# Patient Record
Sex: Male | Born: 1960 | Race: Black or African American | Hispanic: No | Smoking: Former smoker
Health system: Southern US, Community
[De-identification: ages and names within clinical notes are randomized; demographics above are authoritative.]

## PROBLEM LIST (undated history)

## (undated) DIAGNOSIS — I639 Cerebral infarction, unspecified: Secondary | ICD-10-CM

## (undated) DIAGNOSIS — K219 Gastro-esophageal reflux disease without esophagitis: Secondary | ICD-10-CM

## (undated) DIAGNOSIS — F32A Depression, unspecified: Secondary | ICD-10-CM

## (undated) DIAGNOSIS — Z87442 Personal history of urinary calculi: Secondary | ICD-10-CM

## (undated) DIAGNOSIS — I219 Acute myocardial infarction, unspecified: Secondary | ICD-10-CM

## (undated) DIAGNOSIS — I5189 Other ill-defined heart diseases: Secondary | ICD-10-CM

## (undated) DIAGNOSIS — F319 Bipolar disorder, unspecified: Secondary | ICD-10-CM

## (undated) DIAGNOSIS — U071 COVID-19: Secondary | ICD-10-CM

## (undated) DIAGNOSIS — J45909 Unspecified asthma, uncomplicated: Secondary | ICD-10-CM

## (undated) DIAGNOSIS — E119 Type 2 diabetes mellitus without complications: Secondary | ICD-10-CM

## (undated) DIAGNOSIS — G4730 Sleep apnea, unspecified: Secondary | ICD-10-CM

## (undated) DIAGNOSIS — K760 Fatty (change of) liver, not elsewhere classified: Secondary | ICD-10-CM

## (undated) DIAGNOSIS — M1990 Unspecified osteoarthritis, unspecified site: Secondary | ICD-10-CM

## (undated) DIAGNOSIS — I10 Essential (primary) hypertension: Secondary | ICD-10-CM

## (undated) HISTORY — DX: Type 2 diabetes mellitus without complications: E11.9

## (undated) HISTORY — DX: Gastro-esophageal reflux disease without esophagitis: K21.9

## (undated) HISTORY — DX: COVID-19: U07.1

## (undated) HISTORY — DX: Unspecified osteoarthritis, unspecified site: M19.90

## (undated) HISTORY — DX: Cerebral infarction, unspecified: I63.9

## (undated) HISTORY — PX: KNEE SURGERY: SHX244

## (undated) HISTORY — DX: Acute myocardial infarction, unspecified: I21.9

## (undated) HISTORY — DX: Sleep apnea, unspecified: G47.30

---

## 1982-10-16 HISTORY — PX: COMPLEX WOUND CLOSURE: SHX6446

## 2020-10-28 ENCOUNTER — Other Ambulatory Visit: Payer: Self-pay | Admitting: Internal Medicine

## 2020-10-28 DIAGNOSIS — R1011 Right upper quadrant pain: Secondary | ICD-10-CM

## 2020-11-11 ENCOUNTER — Other Ambulatory Visit: Payer: Self-pay | Admitting: Internal Medicine

## 2020-11-11 ENCOUNTER — Other Ambulatory Visit: Payer: Self-pay

## 2020-11-11 DIAGNOSIS — R1011 Right upper quadrant pain: Secondary | ICD-10-CM

## 2020-11-11 DIAGNOSIS — R140 Abdominal distension (gaseous): Secondary | ICD-10-CM

## 2020-12-01 ENCOUNTER — Ambulatory Visit
Admission: RE | Admit: 2020-12-01 | Discharge: 2020-12-01 | Disposition: A | Discharge status: Home / Self Care | Payer: BC Managed Care – PPO | Source: Ambulatory Visit | Attending: Internal Medicine | Admitting: Internal Medicine

## 2020-12-01 DIAGNOSIS — R1011 Right upper quadrant pain: Secondary | ICD-10-CM

## 2020-12-01 DIAGNOSIS — R140 Abdominal distension (gaseous): Secondary | ICD-10-CM

## 2020-12-03 ENCOUNTER — Other Ambulatory Visit: Payer: Self-pay | Admitting: Internal Medicine

## 2020-12-03 DIAGNOSIS — R140 Abdominal distension (gaseous): Secondary | ICD-10-CM

## 2020-12-03 DIAGNOSIS — R748 Abnormal levels of other serum enzymes: Secondary | ICD-10-CM

## 2020-12-03 DIAGNOSIS — R1011 Right upper quadrant pain: Secondary | ICD-10-CM

## 2020-12-03 DIAGNOSIS — R932 Abnormal findings on diagnostic imaging of liver and biliary tract: Secondary | ICD-10-CM

## 2020-12-03 DIAGNOSIS — Z8619 Personal history of other infectious and parasitic diseases: Secondary | ICD-10-CM

## 2021-01-01 ENCOUNTER — Other Ambulatory Visit: Payer: Self-pay

## 2021-01-01 ENCOUNTER — Ambulatory Visit
Admission: RE | Admit: 2021-01-01 | Discharge: 2021-01-01 | Disposition: A | Discharge status: Home / Self Care | Payer: BC Managed Care – PPO | Source: Ambulatory Visit | Attending: Internal Medicine | Admitting: Internal Medicine

## 2021-01-01 DIAGNOSIS — R140 Abdominal distension (gaseous): Secondary | ICD-10-CM

## 2021-01-01 DIAGNOSIS — R1011 Right upper quadrant pain: Secondary | ICD-10-CM

## 2021-01-01 DIAGNOSIS — Z8619 Personal history of other infectious and parasitic diseases: Secondary | ICD-10-CM

## 2021-01-01 DIAGNOSIS — R748 Abnormal levels of other serum enzymes: Secondary | ICD-10-CM

## 2021-01-01 DIAGNOSIS — R932 Abnormal findings on diagnostic imaging of liver and biliary tract: Secondary | ICD-10-CM

## 2021-01-01 MED ORDER — GADOBENATE DIMEGLUMINE 529 MG/ML IV SOLN
18.0000 mL | Freq: Once | INTRAVENOUS | Status: AC | PRN
  Administered 2021-01-01: 18 mL via INTRAVENOUS

## 2021-01-28 ENCOUNTER — Other Ambulatory Visit: Payer: Self-pay | Admitting: Physician Assistant

## 2021-01-28 DIAGNOSIS — M25532 Pain in left wrist: Secondary | ICD-10-CM

## 2021-02-06 ENCOUNTER — Other Ambulatory Visit: Payer: Self-pay

## 2021-02-06 ENCOUNTER — Ambulatory Visit
Admission: RE | Admit: 2021-02-06 | Discharge: 2021-02-06 | Disposition: A | Discharge status: Home / Self Care | Payer: Medicaid Other | Source: Ambulatory Visit | Attending: Physician Assistant | Admitting: Physician Assistant

## 2021-02-06 DIAGNOSIS — M25532 Pain in left wrist: Secondary | ICD-10-CM

## 2021-02-10 ENCOUNTER — Other Ambulatory Visit: Payer: Self-pay | Admitting: Orthopedic Surgery

## 2021-02-16 ENCOUNTER — Other Ambulatory Visit (HOSPITAL_COMMUNITY): Payer: Self-pay

## 2021-02-17 ENCOUNTER — Other Ambulatory Visit (HOSPITAL_COMMUNITY): Payer: Self-pay

## 2021-02-17 MED ORDER — DOXYCYCLINE HYCLATE 100 MG PO TABS
100.0000 mg | ORAL_TABLET | Freq: Two times a day (BID) | ORAL | 0 refills | Status: AC
  Filled 2021-02-17 – 2021-03-17 (×2): qty 14, 7d supply, fill #0

## 2021-02-18 ENCOUNTER — Other Ambulatory Visit (HOSPITAL_BASED_OUTPATIENT_CLINIC_OR_DEPARTMENT_OTHER): Payer: Self-pay

## 2021-02-18 DIAGNOSIS — G4710 Hypersomnia, unspecified: Secondary | ICD-10-CM

## 2021-02-18 DIAGNOSIS — R0683 Snoring: Secondary | ICD-10-CM

## 2021-02-18 DIAGNOSIS — G4752 REM sleep behavior disorder: Secondary | ICD-10-CM

## 2021-02-18 DIAGNOSIS — R0681 Apnea, not elsewhere classified: Secondary | ICD-10-CM

## 2021-03-07 ENCOUNTER — Other Ambulatory Visit (HOSPITAL_COMMUNITY): Payer: Self-pay

## 2021-03-07 MED ORDER — ATORVASTATIN CALCIUM 40 MG PO TABS
ORAL_TABLET | ORAL | 3 refills | Status: DC
  Filled 2021-03-07: qty 30, 30d supply, fill #0
  Filled 2021-03-08: qty 30, fill #0
  Filled 2021-03-17: qty 30, 30d supply, fill #0

## 2021-03-08 ENCOUNTER — Other Ambulatory Visit (HOSPITAL_COMMUNITY): Payer: Self-pay

## 2021-03-17 ENCOUNTER — Other Ambulatory Visit (HOSPITAL_COMMUNITY): Payer: Self-pay

## 2021-03-21 ENCOUNTER — Other Ambulatory Visit: Payer: Self-pay

## 2021-03-21 ENCOUNTER — Ambulatory Visit (HOSPITAL_BASED_OUTPATIENT_CLINIC_OR_DEPARTMENT_OTHER): Payer: Medicaid Other | Attending: Internal Medicine | Admitting: Internal Medicine

## 2021-03-21 VITALS — Ht 70.0 in | Wt 205.0 lb

## 2021-03-21 DIAGNOSIS — R0681 Apnea, not elsewhere classified: Secondary | ICD-10-CM

## 2021-03-21 DIAGNOSIS — G4730 Sleep apnea, unspecified: Secondary | ICD-10-CM | POA: Insufficient documentation

## 2021-03-21 DIAGNOSIS — G4733 Obstructive sleep apnea (adult) (pediatric): Secondary | ICD-10-CM

## 2021-03-21 DIAGNOSIS — R0683 Snoring: Secondary | ICD-10-CM

## 2021-03-21 DIAGNOSIS — G4752 REM sleep behavior disorder: Secondary | ICD-10-CM

## 2021-03-21 DIAGNOSIS — G4710 Hypersomnia, unspecified: Secondary | ICD-10-CM

## 2021-03-24 NOTE — Procedures (Signed)
   NAME: Antonio Lee DATE OF BIRTH:  November 13, 1960 MEDICAL RECORD NUMBER [id number]  LOCATION: [facility]  PHYSICIAN: Deretha Emory  DATE OF STUDY: 03/21/2021  SLEEP STUDY TYPE: Nocturnal Polysomnogram               REFERRING PHYSICIAN: Deretha Emory, MD  EPWORTH SLEEPINESS SCORE:  10 HEIGHT: 5\' 10"  (177.8 cm)  WEIGHT: 205 lb (93 kg)    Body mass index is 29.41 kg/m.  NECK SIZE: 16.5 in.  CLINICAL INFORMATION The patient was referred to the sleep center for PSG for evaluation of excessive daytime sleepiness, loud snoring, and witnessed apnea.   MEDICATIONS No sleep medicine administered.  SLEEP STUDY TECHNIQUE A multi-channel overnight Polysomnography study was performed. The channels recorded and monitored were central and occipital EEG, electrooculogram (EOG), submentalis EMG (chin), nasal and oral airflow, thoracic and abdominal wall motion, anterior tibialis EMG, snore microphone, electrocardiogram, and a pulse oximetry.  TECHNICAL COMMENTS Comments added by Technician: None Comments added by Scorer: N/A  SLEEP ARCHITECTURE The study was initiated at 11:12:16 PM and terminated at 5:17:28 AM. The total recorded time was 365.2 minutes. EEG confirmed total sleep time was 286.5 minutes yielding a sleep efficiency of 78.5%. Sleep onset after lights out was 32.5 minutes with a REM latency of 38.5 minutes. The patient spent 5.6% of the night in stage N1 sleep, 56.4% in stage N2 sleep, 0.0% in stage N3 and 38.1% in REM. Wake after sleep onset (WASO) was 46.2 minutes. The Arousal Index was 11.5/hour.  RESPIRATORY PARAMETERS There were a total of 103 respiratory disturbances out of which 62 were apneas ( 62 obstructive, 0 mixed, 0 central) and 41 hypopneas. The apnea/hypopnea index (AHI) was 21.6 events/hour. The central sleep apnea index was 0 events/hour. The REM AHI was 43.5 events/hour and NREM AHI was 8.1 events/hour. The supine AHI was 20.1 events/hour  and the non supine AHI was 22.5 events/hour. The patient was supine during 39.62% of sleep. Respiratory disturbance index was 23.9 events/hour overall and 46.8 events/hour in REM sleep. Respiratory disturbances were associated with oxygen desaturation down to a nadir of 72.0% during sleep. The mean oxygen saturation during the study was 92.9%. The cumulative time under 88% oxygen saturation was 14 minutes.  LEG MOVEMENT DATA The total leg movements were 0 with a resulting leg movement index of 0.0/hr . Associated arousal with leg movement index was 0.0/hr.  CARDIAC DATA The underlying cardiac rhythm was most consistent with sinus rhythm. Mean heart rate during sleep was 62.9 bpm. Additional rhythm abnormalities include None.  IMPRESSIONS - Moderate Obstructive Sleep apnea(OSA) - Severe oxygen desaturation - No significant periodic leg movements(PLMs) during sleep. However, no significant associated arousals.  DIAGNOSIS - Obstructive Sleep Apnea (G47.33) - Nocturnal Hypoxemia (G47.36)  RECOMMENDATIONS - Therapeutic CPAP titration or trial of auto-adjusting CPAP is recommended.  Marland Kitchen Sleep specialist, American Board of Internal Medicine  ELECTRONICALLY SIGNED ON:  03/24/2021, 9:26 PM [facility] PH: [phone]   FX: [phone] ACCREDITED BY THE AMERICAN ACADEMY OF SLEEP MEDICINE

## 2021-04-04 ENCOUNTER — Emergency Department (HOSPITAL_COMMUNITY): Payer: Medicaid Other

## 2021-04-04 ENCOUNTER — Emergency Department (HOSPITAL_COMMUNITY)
Admission: EM | Admit: 2021-04-04 | Discharge: 2021-04-05 | Disposition: A | Discharge status: Home / Self Care | Payer: Medicaid Other | Attending: Emergency Medicine | Admitting: Emergency Medicine

## 2021-04-04 ENCOUNTER — Other Ambulatory Visit: Payer: Self-pay

## 2021-04-04 DIAGNOSIS — I10 Essential (primary) hypertension: Secondary | ICD-10-CM | POA: Diagnosis not present

## 2021-04-04 DIAGNOSIS — J45909 Unspecified asthma, uncomplicated: Secondary | ICD-10-CM | POA: Insufficient documentation

## 2021-04-04 DIAGNOSIS — R0602 Shortness of breath: Secondary | ICD-10-CM | POA: Diagnosis not present

## 2021-04-04 DIAGNOSIS — Z5321 Procedure and treatment not carried out due to patient leaving prior to being seen by health care provider: Secondary | ICD-10-CM | POA: Diagnosis not present

## 2021-04-04 DIAGNOSIS — R079 Chest pain, unspecified: Secondary | ICD-10-CM

## 2021-04-04 DIAGNOSIS — R0789 Other chest pain: Secondary | ICD-10-CM | POA: Insufficient documentation

## 2021-04-04 DIAGNOSIS — E119 Type 2 diabetes mellitus without complications: Secondary | ICD-10-CM | POA: Diagnosis not present

## 2021-04-04 LAB — BASIC METABOLIC PANEL
Anion gap: 8 (ref 5–15)
BUN: 9 mg/dL (ref 8–23)
CO2: 25 mmol/L (ref 22–32)
Calcium: 9.2 mg/dL (ref 8.9–10.3)
Chloride: 104 mmol/L (ref 98–111)
Creatinine, Ser: 0.71 mg/dL (ref 0.61–1.24)
GFR, Estimated: >60 mL/min (ref >60)
Glucose, Bld: 108 mg/dL — ABNORMAL HIGH (ref 70–99)
Potassium: 3.8 mmol/L (ref 3.5–5.1)
Sodium: 137 mmol/L (ref 135–145)

## 2021-04-04 LAB — TROPONIN I (HIGH SENSITIVITY)
Troponin I (High Sensitivity): 5 ng/L (ref <18)
Troponin I (High Sensitivity): 5 ng/L (ref <18)

## 2021-04-04 LAB — CBC
HCT: 44.6 % (ref 39.0–52.0)
Hemoglobin: 13.7 g/dL (ref 13.0–17.0)
MCH: 22.9 pg — ABNORMAL LOW (ref 26.0–34.0)
MCHC: 30.7 g/dL (ref 30.0–36.0)
MCV: 74.7 fL — ABNORMAL LOW (ref 80.0–100.0)
Platelets: 222 10*3/uL (ref 150–400)
RBC: 5.97 MIL/uL — ABNORMAL HIGH (ref 4.22–5.81)
RDW: 14.4 % (ref 11.5–15.5)
WBC: 3.7 10*3/uL — ABNORMAL LOW (ref 4.0–10.5)
nRBC: 0 % (ref 0.0–0.2)

## 2021-04-04 NOTE — ED Triage Notes (Signed)
Pt c/o CP, SHOB since yesterday. States he feels he had asthma attack in church yesterday, went to see his PCP today & was told ekg was abnormal, sent for eval. Currently endorses 8/10 central chest tightness, worse w cough.  Hx asthma, DM, HTN

## 2021-04-04 NOTE — ED Notes (Signed)
Patient said d/t wait he is leaving

## 2021-04-04 NOTE — ED Provider Notes (Signed)
Emergency Medicine Provider Triage Evaluation Note  Antonio Lee , a 60 y.o. male  was evaluated in triage.  Pt complains of chest pain and shortness of breath since yesterday.  Patient states that he has been having a cough for the past week, states that yesterday he felt as if his chest getting tight and he was slightly short of breath, felt like his typical asthma.  Patient states that he got discharged he got worse, states that his chest felt very tight and he heard wheezing.  Patient states that he has been out of his albuterol and inhaled corticosteroid for a while since he moved here from [place].  Describes chest tightness, no pain.  Was in here from PCP due to abnormal EKG.  Review of Systems  Positive: Chest tightness, shortness of breath Negative: Fevers  Physical Exam  BP 118/82 (BP Location: Right Arm)   Pulse 77   Temp 98.4 F (36.9 C) (Oral)   Resp 13   SpO2 100%  Gen:   Awake, no distress , patient will constantly have bronchospasms in room. Resp:  Normal effort, no wheezing on exam MSK:   Moves extremities without difficulty  Other:    Medical Decision Making  Medically screening exam initiated at 5:35 PM.  Appropriate orders placed.  Antonio Lee was informed that the remainder of the evaluation will be completed by another provider, this initial triage assessment does not replace that evaluation, and the importance of remaining in the ED until their evaluation is complete.    Farrel Gordon, PA-C 04/04/21 1739    Virgina Norfolk, DO 04/04/21 2329
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
## 2021-04-11 ENCOUNTER — Other Ambulatory Visit: Payer: Self-pay

## 2021-04-11 ENCOUNTER — Ambulatory Visit: Payer: Medicaid Other | Admitting: Cardiovascular Disease

## 2021-04-11 ENCOUNTER — Encounter: Payer: Self-pay | Admitting: Cardiovascular Disease

## 2021-04-11 VITALS — BP 118/70 | HR 80 | Ht 70.0 in | Wt 204.4 lb

## 2021-04-11 DIAGNOSIS — R079 Chest pain, unspecified: Secondary | ICD-10-CM

## 2021-04-11 DIAGNOSIS — E782 Mixed hyperlipidemia: Secondary | ICD-10-CM

## 2021-04-11 DIAGNOSIS — R9431 Abnormal electrocardiogram [ECG] [EKG]: Secondary | ICD-10-CM | POA: Diagnosis not present

## 2021-04-11 DIAGNOSIS — R0602 Shortness of breath: Secondary | ICD-10-CM

## 2021-04-11 MED ORDER — EZETIMIBE 10 MG PO TABS: 10.0000 mg | ORAL_TABLET | Freq: Every day | ORAL | 3 refills | Status: DC

## 2021-04-11 NOTE — Patient Instructions (Addendum)
Medication Instructions:   START Zetia 10 mg daily for cholesterol  If you need a refill on your cardiac medications before your next appointment, please call your pharmacy.   Lab work: No new labs needed  Testing/Procedures: (We will call via phone with results, may also see on MyChart)  Your physician has requested that you have an echocardiogram (shortness of breath, chest pain) Echocardiography is a painless test that uses sound waves to create images of your heart. It provides your doctor with information about the size and shape of your heart and how well your heart's chambers and valves are working. This procedure takes approximately one hour. There are no restrictions for this procedure.  There is a possibility that an IV may need to be started during your test to inject an image enhancing agent. This is done to obtain more optimal pictures of your heart. Therefore we ask that you do at least drink some water prior to coming in to hydrate your veins.   Follow-Up: At [facility], you and your health needs are our priority.  As part of our continuing mission to provide you with exceptional heart care, we have created designated Provider Care Teams.  These Care Teams include your primary Cardiologist (physician) and Advanced Practice Providers (APPs -  Physician Assistants and Nurse Practitioners) who all work together to provide you with the care you need, when you need it.  You will need a follow up appointment as needed  Providers on your designated Care Team:   Nicolasa Ducking, NP Eula Listen, PA-C Marisue Ivan, PA-C [place], [place]  COVID-19 Vaccine Information can be found at: [email or web address] For questions related to vaccine distribution or appointments, please email [email or web address] or call [phone].

## 2021-05-25 ENCOUNTER — Other Ambulatory Visit: Payer: Medicaid Other

## 2021-05-27 ENCOUNTER — Ambulatory Visit (INDEPENDENT_AMBULATORY_CARE_PROVIDER_SITE_OTHER): Payer: Medicaid Other

## 2021-05-27 ENCOUNTER — Other Ambulatory Visit: Payer: Self-pay

## 2021-05-27 DIAGNOSIS — R0602 Shortness of breath: Secondary | ICD-10-CM

## 2021-05-27 DIAGNOSIS — R079 Chest pain, unspecified: Secondary | ICD-10-CM

## 2021-05-27 LAB — ECHOCARDIOGRAM COMPLETE
AR max vel: 2.39 cm2
AV Area VTI: 2.82 cm2
AV Area mean vel: 2.72 cm2
AV Mean grad: 3 mmHg
AV Peak grad: 7.5 mmHg
Ao pk vel: 1.37 m/s
Area-P 1/2: 4.31 cm2
Calc EF: 61.6 %
S' Lateral: 3.1 cm
Single Plane A2C EF: 57 %
Single Plane A4C EF: 61 %

## 2021-05-30 ENCOUNTER — Telehealth: Payer: Self-pay

## 2021-05-30 NOTE — Telephone Encounter (Signed)
Able to reach pt regarding his recent ECHO Dr. Mariah Milling had a chance to review his results and advised   "Echo  Normal ejection fraction,  no significant valve disease  Good study "  Antonio Lee very thankful for the phone call of his results, all questions and concerns were address with nothing further at this time. Will see at next schedule f/u appt.

## 2021-06-02 ENCOUNTER — Other Ambulatory Visit: Payer: Self-pay | Admitting: Internal Medicine

## 2021-06-02 DIAGNOSIS — R9389 Abnormal findings on diagnostic imaging of other specified body structures: Secondary | ICD-10-CM

## 2021-06-10 ENCOUNTER — Institutional Professional Consult (permissible substitution): Payer: Medicaid Other | Admitting: Pulmonary Disease

## 2021-06-14 ENCOUNTER — Ambulatory Visit
Admission: RE | Admit: 2021-06-14 | Discharge: 2021-06-14 | Disposition: A | Discharge status: Home / Self Care | Payer: Medicaid Other | Source: Ambulatory Visit | Attending: Internal Medicine | Admitting: Internal Medicine

## 2021-06-14 DIAGNOSIS — R9389 Abnormal findings on diagnostic imaging of other specified body structures: Secondary | ICD-10-CM

## 2021-06-20 ENCOUNTER — Encounter: Payer: Self-pay | Admitting: Podiatry

## 2021-06-20 ENCOUNTER — Other Ambulatory Visit: Payer: Self-pay

## 2021-06-20 ENCOUNTER — Ambulatory Visit: Payer: Medicaid Other | Admitting: Podiatry

## 2021-06-20 ENCOUNTER — Other Ambulatory Visit: Payer: Self-pay | Admitting: *Deleted

## 2021-06-20 DIAGNOSIS — M21612 Bunion of left foot: Secondary | ICD-10-CM | POA: Diagnosis not present

## 2021-06-20 DIAGNOSIS — E0843 Diabetes mellitus due to underlying condition with diabetic autonomic (poly)neuropathy: Secondary | ICD-10-CM

## 2021-06-20 DIAGNOSIS — M21611 Bunion of right foot: Secondary | ICD-10-CM | POA: Diagnosis not present

## 2021-06-20 NOTE — Addendum Note (Signed)
Addended by: Hadley Pen R on: 06/20/2021 10:45 AM   Modules accepted: Orders

## 2021-06-20 NOTE — Progress Notes (Signed)
  Subjective:  Patient ID: Antonio Lee, male    DOB: 03-Jan-1961,   MRN: [id number]  Chief Complaint  Patient presents with   Foot Pain    I had covid last year and I got neuropathy in my legs and feet and the physical therapy did good for the upper thighs and my feet and toes are tingling and numb and I would like to get diabetic shoes    60 y.o. male presents for  foot pain and diabetic foot check and requesting shoes. Patient relates he is diabetic but does not recall his last A1c and not in the system. He relates neuropathy in his left foot. After COVID has done PT to help with nerve issues.. Denies any other pedal complaints. Denies n/v/f/c.  PCP is [place] PA last seen 12/01/20   Past Medical History:  Diagnosis Date   COVID-19    CVA (cerebral vascular accident) (HCC)    Diabetes mellitus, type II (HCC)    GERD (gastroesophageal reflux disease)    MI (myocardial infarction) (HCC)    Osteoarthritis    Sleep apnea     Objective:  Physical Exam: Vascular: DP/PT pulses 2/4 bilateral. CFT <3 seconds. Normal hair growth on digits. No edema.  Skin. No lacerations or abrasions bilateral feet. Nails 1-5 are thickened discolored   Musculoskeletal: MMT 5/5 bilateral lower extremities in DF, PF, Inversion and Eversion. Deceased ROM in DF of ankle joint. Tender around medial eminence of first toe joint.  Neurological: Sensation intact to light touch. Diminished protective sensation bilateral.   Assessment:   1. Diabetes mellitus due to underlying condition with diabetic autonomic neuropathy, unspecified whether long term insulin use (HCC)   2. Bilateral bunions      Plan:  Patient was evaluated and treated and all questions answered. -Discussed and educated patient on diabetic foot care, especially with  regards to the vascular, neurological and musculoskeletal systems.  -Stressed the importance of good glycemic control and the detriment of not  controlling glucose  levels in relation to the foot. -Discussed supportive shoes at all times and checking feet regularly.  -Prescription for biotech for diabetic shoes.  -Answered all patient questions -Patient to return 1 year for diabetic foot check.  -Patient advised to call the office if any problems or questions arise in the meantime.   Louann Sjogren, DPM

## 2021-07-04 ENCOUNTER — Institutional Professional Consult (permissible substitution): Payer: Medicaid Other | Admitting: Pulmonary Disease

## 2021-07-15 ENCOUNTER — Ambulatory Visit (INDEPENDENT_AMBULATORY_CARE_PROVIDER_SITE_OTHER): Payer: Medicaid Other | Admitting: Pulmonary Disease

## 2021-07-15 ENCOUNTER — Other Ambulatory Visit: Payer: Self-pay

## 2021-07-15 ENCOUNTER — Encounter: Payer: Self-pay | Admitting: Pulmonary Disease

## 2021-07-15 VITALS — BP 108/60 | HR 82 | Temp 98.5°F | Ht 70.0 in | Wt 207.0 lb

## 2021-07-15 DIAGNOSIS — R053 Chronic cough: Secondary | ICD-10-CM | POA: Insufficient documentation

## 2021-07-15 DIAGNOSIS — R0609 Other forms of dyspnea: Secondary | ICD-10-CM

## 2021-07-15 DIAGNOSIS — J4540 Moderate persistent asthma, uncomplicated: Secondary | ICD-10-CM | POA: Diagnosis not present

## 2021-07-15 DIAGNOSIS — U099 Post covid-19 condition, unspecified: Secondary | ICD-10-CM

## 2021-07-15 MED ORDER — FLUTICASONE-SALMETEROL 250-50 MCG/ACT IN AEPB: 1.0000 | INHALATION_SPRAY | Freq: Two times a day (BID) | RESPIRATORY_TRACT | 4 refills | Status: DC

## 2021-07-15 MED ORDER — PROAIR HFA 108 (90 BASE) MCG/ACT IN AERS: 2.0000 | INHALATION_SPRAY | RESPIRATORY_TRACT | 6 refills | Status: DC | PRN

## 2021-07-15 NOTE — Progress Notes (Signed)
Subjective:   PATIENT ID: Antonio Lee GENDER: male DOB: 1961-02-25, MRN: [id number]   HPI  Chief Complaint  Patient presents with   Consult    asthma    Reason for Visit: New consult for asthma  [name] is a 60 year old male former smoker with asthma, OSA, HTN, hx latent TB DM2, hx CVA 2019, hx hepatitis C who is referred as a new consult for asthma and chronic cough.  Diagnosed with asthma 20 years ago and on PRN albuterol. Inconsistently taking ICS/LABA inhalers in the past.  He was referred by Antonio Hammed, PA at [place] for asthma. Note from 05/09/21 reviewed. Wheezing noted on exam. Started on albuterol. Had an abnormal CXR however findings not faxed. CT chest was obtained and demonstrated bilateral scarring and calcified subcarinal and left hilar adenopathy. He is on Advair 100-50 mcg per med list.  He was hospitalized for two weeks COVID-19 pneumonia requiring ICU level care but not on the ventilator in April 2021 in [place]. He has had a chronic cough since COVID-19. He reports wheezing and shortness of breath daily with walking and moderate activity. He walks 1/2 to 1 mile daily. He has used the albuterol inhaler frequently three times a day with no relief. He uses advair as needed. He reports reflux. Also has nasal congestion and sinus issues.  He was diagnosed with OSA on CPAP. He has been seeing Dr. Maxwell Caul who ordered the test. [facility] manages the machine.   Asthma Control Test ACT Total Score  07/15/2021 15   Social History: 30 pack-years. Quit 10 years ago Retired Development worker, [employer]  I have personally reviewed patient's past medical/family/social history, allergies, current medications.  Past Medical History:  Diagnosis Date   COVID-19    CVA (cerebral vascular accident) ([place])    Diabetes mellitus, type II ([place])    GERD (gastroesophageal reflux disease)    MI (myocardial infarction) ([place])    Osteoarthritis    Sleep apnea      Family History   Problem Relation Age of Onset   Hypertension Mother    Lupus Mother    Heart attack Father 51   Hyperlipidemia Father    Hypertension Father    Diabetes Father    Heart attack Paternal Aunt        CABG   Heart disease Paternal Aunt    Stroke Paternal Grandfather      Social History   Occupational History   Not on file  Tobacco Use   Smoking status: Former    Packs/day: 1.00    Years: 30.00    Pack years: 30.00    Types: Cigarettes    Quit date: 2012    Years since quitting: 10.8   Smokeless tobacco: Not on file  Vaping Use   Vaping Use: Never used  Substance and Sexual Activity   Alcohol use: Yes    Comment: social drinker   Drug use: Not on file   Sexual activity: Not on file    No Known Allergies   Outpatient Medications Prior to Visit  Medication Sig Dispense Refill   atorvastatin (LIPITOR) 40 MG tablet Take 1 tablet by mouth Once a day 30 tablet 3   ezetimibe (ZETIA) 10 MG tablet Take 1 tablet (10 mg total) by mouth daily. 90 tablet 3   gabapentin (NEURONTIN) 100 MG capsule Take 100 mg by mouth daily.     lisinopril (ZESTRIL) 10 MG tablet Take 10 mg by mouth daily.  metFORMIN (GLUCOPHAGE) 500 MG tablet Take 500 mg by mouth 2 (two) times daily.     ONETOUCH VERIO test strip daily.     ADVAIR DISKUS 100-50 MCG/ACT AEPB SMARTSIG:1 Unspecified By Mouth Twice Daily     PROAIR HFA 108 (90 Base) MCG/ACT inhaler SMARTSIG:By Mouth     atorvastatin (LIPITOR) 20 MG tablet Take 20 mg by mouth daily.     BINAXNOW COVID-19 AG HOME TEST KIT  (Patient not taking: Reported on 07/15/2021)     pantoprazole (PROTONIX) 40 MG tablet 1 tablet (Patient not taking: Reported on 07/15/2021)     predniSONE (STERAPRED UNI-PAK 21 TAB) 10 MG (21) TBPK tablet Take by mouth. (Patient not taking: Reported on 07/15/2021)     No facility-administered medications prior to visit.    Review of Systems  Constitutional:  Negative for chills, diaphoresis, fever, malaise/fatigue and weight loss.   HENT:  Negative for congestion, ear pain and sore throat.   Respiratory:  Positive for shortness of breath. Negative for cough, hemoptysis, sputum production and wheezing.   Cardiovascular:  Negative for chest pain, palpitations and leg swelling.  Gastrointestinal:  Positive for abdominal pain. Negative for heartburn and nausea.  Genitourinary:  Negative for frequency.  Musculoskeletal:  Positive for joint pain. Negative for myalgias.  Skin:  Negative for itching and rash.  Neurological:  Negative for dizziness, weakness and headaches.  Endo/Heme/Allergies:  Does not bruise/bleed easily.  Psychiatric/Behavioral:  Positive for depression. The patient is nervous/anxious.     Objective:   Vitals:   07/15/21 1120  BP: 108/60  Pulse: 82  Temp: 98.5 F (36.9 C)  TempSrc: Oral  SpO2: 98%  Weight: 207 lb (93.9 kg)  Height: 5' 10"  (1.778 m)   SpO2: 98 % O2 Device: None (Room air)  Physical Exam: General: Well-appearing, no acute distress HENT: [place], AT Eyes: EOMI, no scleral icterus Respiratory: Clear to auscultation bilaterally.  No crackles, wheezing or rales Cardiovascular: RRR, -M/R/G, no JVD Extremities:-Edema,-tenderness Neuro: AAO x4, CNII-XII grossly intact Psych: Normal mood, normal affect  Data Reviewed:  Imaging: CT Chest 06/14/21 - Bilateral pulmonary parenchymal scarring, subcarinal and left hilar adenopathy suggestive of granulomatous disease  PFT: None available  Labs: CBC    Component Value Date/Time   WBC 3.7 (L) 04/04/2021 1735   RBC 5.97 (H) 04/04/2021 1735   HGB 13.7 04/04/2021 1735   HCT 44.6 04/04/2021 1735   PLT 222 04/04/2021 1735   MCV 74.7 (L) 04/04/2021 1735   MCH 22.9 (L) 04/04/2021 1735   MCHC 30.7 04/04/2021 1735   RDW 14.4 04/04/2021 1735      Assessment & Plan:   Discussion: 60 year old male former smoker with asthma, OSA, HTN, hx latent TB s/p one year INH, DM2, hx CVA 2019, hx hepatitis C who is referred as a new consult for  asthma and chronic cough. Multiple causes for cough are possible including uncontrolled asthma, acid reflux and post-nasal drainage. His symptoms may be complicated with his history of COVID-19 infection. Also consider that ACE inhibitor could be the culprit and consider discontinuation of medication.   Chronic Cough  Post-Nasal Drainage Treatment       --Cetirizine 10 mg daily (Zyrtec)        --Flonase 2 sprays per nare daily         Reflux Treatment       --Omeprazole 20 mg daily       --Avoid late meals, caffeine, alcohol, spicy food and overeating.   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 CT Lung Screen - qualified. Will discuss at next visit. Due 06/2022  No orders of the defined types were placed in this encounter.  Meds ordered this encounter  Medications   fluticasone-salmeterol (ADVAIR DISKUS) 250-50 MCG/ACT AEPB    Sig: Inhale 1 puff into the lungs in the morning and at bedtime.    Dispense:  60 each    Refill:  4   PROAIR HFA 108 (90 Base) MCG/ACT inhaler    Sig: Inhale 2 puffs into the lungs every 4 (four) hours as needed for wheezing or shortness of breath.    Dispense:  18 g    Refill:  6    Return in about 26 days (around 08/10/2021).  I have spent a total time of 45-minutes on the day of the appointment reviewing prior documentation, coordinating care and discussing medical diagnosis and plan with the patient/family. Imaging, labs and tests included in this note have been reviewed and  interpreted independently by me.  [place], MD [facility] 07/15/2021 12:22 PM  Office Number [phone]

## 2021-07-15 NOTE — Patient Instructions (Addendum)
Chronic Cough Multiple causes for cough are possible including uncontrolled asthma, acid reflux and post-nasal drainage. We recommend the following to address these issues.   Post-Nasal Drainage Treatment       --Cetirizine 10 mg daily (Zyrtec)        --Flonase 2 sprays per nare daily         Reflux Treatment       --Omeprazole 20 mg daily       --Avoid late meals, caffeine, alcohol, spicy food and overeating.   Medications  --Recommend discontinuing lisinopril and transitioning from ACE to ARB  --Discuss with your primary care team about changing our blood pressure medications  Post-inflammatory fibrosis History of COVID-19 Asthma-like symptoms --ARRANGE pulmonary function test --START Advair 250-50 mcg ONE puff TWICE a day --CONTINUE Albuterol as needed for shortness of breath or wheezing  OSA  --Obtain CPAP compliance report at next visit   Follow-up with me on 08/10/21 for PFTs and CPAP compliance review

## 2021-07-25 ENCOUNTER — Telehealth: Payer: Self-pay | Admitting: Pulmonary Disease

## 2021-07-25 NOTE — Telephone Encounter (Signed)
JE please advise.  Pt is interested in getting set up with Inspire.   thanks

## 2021-07-26 NOTE — Telephone Encounter (Signed)
Please contact patient for the following:  I would be happy to discuss and refer for Inspire device at our next visit. Since he was recently diagnosed with OSA in July and previously followed by Dr. Earl Gala, I would like to formally address his OSA on that clinic visit and review a compliance report and effectiveness of CPAP as well as determine any underlying central apneas. Please encourage him to wear CPAP in the meantime and I will see him on 08/10/21.

## 2021-07-26 NOTE — Telephone Encounter (Signed)
Call made to patient, confirmed DOB. Made aware this will be discussed at OV on 11/30. Voiced understanding.   Nothing further needed at this time.

## 2021-08-10 ENCOUNTER — Ambulatory Visit: Payer: Medicaid Other | Admitting: Pulmonary Disease

## 2021-08-23 ENCOUNTER — Other Ambulatory Visit: Payer: Self-pay | Admitting: Internal Medicine

## 2021-08-23 ENCOUNTER — Ambulatory Visit
Admission: RE | Admit: 2021-08-23 | Discharge: 2021-08-23 | Disposition: A | Discharge status: Home / Self Care | Payer: Medicaid Other | Source: Ambulatory Visit | Attending: Internal Medicine | Admitting: Internal Medicine

## 2021-08-23 DIAGNOSIS — M25561 Pain in right knee: Secondary | ICD-10-CM

## 2021-10-04 ENCOUNTER — Ambulatory Visit: Payer: Medicaid Other | Admitting: Pulmonary Disease

## 2021-10-04 ENCOUNTER — Ambulatory Visit: Payer: Medicaid Other | Admitting: Primary Care

## 2022-01-22 ENCOUNTER — Other Ambulatory Visit: Payer: Self-pay

## 2022-01-22 ENCOUNTER — Encounter (HOSPITAL_COMMUNITY): Payer: Self-pay | Admitting: Emergency Medicine

## 2022-01-22 ENCOUNTER — Emergency Department (HOSPITAL_COMMUNITY)
Admission: EM | Admit: 2022-01-22 | Discharge: 2022-01-22 | Disposition: A | Discharge status: Home / Self Care | Payer: Medicaid Other | Attending: Emergency Medicine | Admitting: Emergency Medicine

## 2022-01-22 ENCOUNTER — Emergency Department (HOSPITAL_COMMUNITY): Payer: Medicaid Other

## 2022-01-22 DIAGNOSIS — R7401 Elevation of levels of liver transaminase levels: Secondary | ICD-10-CM | POA: Insufficient documentation

## 2022-01-22 DIAGNOSIS — K760 Fatty (change of) liver, not elsewhere classified: Secondary | ICD-10-CM | POA: Diagnosis not present

## 2022-01-22 DIAGNOSIS — R1084 Generalized abdominal pain: Secondary | ICD-10-CM

## 2022-01-22 DIAGNOSIS — Z7984 Long term (current) use of oral hypoglycemic drugs: Secondary | ICD-10-CM | POA: Diagnosis not present

## 2022-01-22 DIAGNOSIS — E119 Type 2 diabetes mellitus without complications: Secondary | ICD-10-CM | POA: Insufficient documentation

## 2022-01-22 LAB — CBC
HCT: 42.3 % (ref 39.0–52.0)
Hemoglobin: 13.7 g/dL (ref 13.0–17.0)
MCH: 23.9 pg — ABNORMAL LOW (ref 26.0–34.0)
MCHC: 32.4 g/dL (ref 30.0–36.0)
MCV: 73.8 fL — ABNORMAL LOW (ref 80.0–100.0)
Platelets: 242 10*3/uL (ref 150–400)
RBC: 5.73 MIL/uL (ref 4.22–5.81)
RDW: 16.5 % — ABNORMAL HIGH (ref 11.5–15.5)
WBC: 6.2 10*3/uL (ref 4.0–10.5)
nRBC: 0 % (ref 0.0–0.2)

## 2022-01-22 LAB — URINALYSIS, ROUTINE W REFLEX MICROSCOPIC
Bilirubin Urine: NEGATIVE
Glucose, UA: NEGATIVE mg/dL
Hgb urine dipstick: NEGATIVE
Ketones, ur: NEGATIVE mg/dL
Leukocytes,Ua: NEGATIVE
Nitrite: NEGATIVE
Protein, ur: NEGATIVE mg/dL
Specific Gravity, Urine: 1.002 — ABNORMAL LOW (ref 1.005–1.030)
pH: 6 (ref 5.0–8.0)

## 2022-01-22 LAB — COMPREHENSIVE METABOLIC PANEL
ALT: 55 U/L — ABNORMAL HIGH (ref 0–44)
AST: 61 U/L — ABNORMAL HIGH (ref 15–41)
Albumin: 4.1 g/dL (ref 3.5–5.0)
Alkaline Phosphatase: 75 U/L (ref 38–126)
Anion gap: 10 (ref 5–15)
BUN: <5 mg/dL — ABNORMAL LOW (ref 8–23)
CO2: 22 mmol/L (ref 22–32)
Calcium: 9 mg/dL (ref 8.9–10.3)
Chloride: 105 mmol/L (ref 98–111)
Creatinine, Ser: 0.64 mg/dL (ref 0.61–1.24)
GFR, Estimated: >60 mL/min (ref >60)
Glucose, Bld: 116 mg/dL — ABNORMAL HIGH (ref 70–99)
Potassium: 3.6 mmol/L (ref 3.5–5.1)
Sodium: 137 mmol/L (ref 135–145)
Total Bilirubin: 1.1 mg/dL (ref 0.3–1.2)
Total Protein: 7.2 g/dL (ref 6.5–8.1)

## 2022-01-22 LAB — LIPASE, BLOOD: Lipase: 31 U/L (ref 11–51)

## 2022-01-22 MED ORDER — LIDOCAINE VISCOUS HCL 2 % MT SOLN
15.0000 mL | Freq: Once | OROMUCOSAL | Status: AC
  Administered 2022-01-22: 15 mL via ORAL
  Filled 2022-01-22: qty 15

## 2022-01-22 MED ORDER — DICYCLOMINE HCL 10 MG PO CAPS
10.0000 mg | ORAL_CAPSULE | Freq: Once | ORAL | Status: AC
  Administered 2022-01-22: 10 mg via ORAL
  Filled 2022-01-22: qty 1

## 2022-01-22 MED ORDER — OMEPRAZOLE 20 MG PO CPDR: 20.0000 mg | DELAYED_RELEASE_CAPSULE | Freq: Every day | ORAL | 0 refills | Status: DC

## 2022-01-22 MED ORDER — IOHEXOL 300 MG/ML  SOLN
100.0000 mL | Freq: Once | INTRAMUSCULAR | Status: AC | PRN
  Administered 2022-01-22: 100 mL via INTRAVENOUS

## 2022-01-22 MED ORDER — DICYCLOMINE HCL 20 MG PO TABS: 20.0000 mg | ORAL_TABLET | Freq: Two times a day (BID) | ORAL | 0 refills | Status: DC

## 2022-01-22 MED ORDER — ALUM & MAG HYDROXIDE-SIMETH 200-200-20 MG/5ML PO SUSP
30.0000 mL | Freq: Once | ORAL | Status: AC
  Administered 2022-01-22: 30 mL via ORAL
  Filled 2022-01-22: qty 30

## 2022-01-22 MED ORDER — SODIUM CHLORIDE 0.9 % IV BOLUS
500.0000 mL | Freq: Once | INTRAVENOUS | Status: AC
  Administered 2022-01-22: 500 mL via INTRAVENOUS

## 2022-01-22 NOTE — ED Notes (Signed)
Patient transported to CT 

## 2022-01-22 NOTE — ED Triage Notes (Signed)
C/o generalized abd pain and bloating x 2 months.  Reports diarrhea yesterday.  Denies nausea and vomiting. ?

## 2022-01-22 NOTE — ED Provider Notes (Signed)
?[facility] EMERGENCY DEPARTMENT ?Provider Note ? ? ?CSN: [id number] ?Arrival date & time: 01/22/22  0850 ? ?  ? ?History ? ?Chief Complaint  ?Patient presents with  ? Abdominal Pain  ? ? ?Antonio Lee is a 61 y.o. male. ? ?Patient with history of diabetes and GERD presents today with complaints of abdominal pain.  He states that same has been progressively worsening for the past 2 months.  Pain is located throughout his abdomen and does not radiate.  He also states that he feels he his abdomen is more distended than normal.  States that he was seen by [facility] and set up a colonoscopy, however given his progressive the worsening pain he does not feel that he can make it into that appointment.  He also endorses 1 episode of vomiting yesterday and diarrhea for several days that ended yesterday morning.  He states that he has not had a bowel movement since his last episode of diarrhea yesterday morning.  Of note, patient states his last colonoscopy was 15 years ago and several polyps were found.  Patient denies any fevers, chills, hematuria, dysuria, hematochezia, melena. Denies history of abdominal surgeries. ? ?The history is provided by the patient. No language interpreter was used.  ?Abdominal Pain ?Associated symptoms: diarrhea (resolved since yesterday), nausea (resolved since yesterday) and vomiting (resolved since yesterday)   ?Associated symptoms: no chest pain, no chills, no dysuria, no fever and no shortness of breath   ? ?  ? ?Home Medications ?Prior to Admission medications   ?Medication Sig Start Date End Date Taking? Authorizing Provider  ?atorvastatin (LIPITOR) 20 MG tablet Take 20 mg by mouth daily. 07/07/21   [provider]  ?atorvastatin (LIPITOR) 40 MG tablet Take 1 tablet by mouth Once a day 03/07/21     ?BINAXNOW COVID-19 [place] TEST KIT  05/29/21   [provider]  ?ezetimibe (ZETIA) 10 MG tablet Take 1 tablet (10 mg total) by mouth daily. 04/11/21   Gollan,  Timothy J, MD  ?fluticasone-salmeterol (ADVAIR DISKUS) 250-50 MCG/ACT AEPB Inhale 1 puff into the lungs in the morning and at bedtime. 07/15/21   Ellison, Chi Jane, MD  ?gabapentin (NEURONTIN) 100 MG capsule Take 100 mg by mouth daily. 04/03/21   [provider]  ?lisinopril (ZESTRIL) 10 MG tablet Take 10 mg by mouth daily. 12/22/20   [provider]  ?metFORMIN (GLUCOPHAGE) 500 MG tablet Take 500 mg by mouth 2 (two) times daily. 12/22/20   [provider]  ?ONETOUCH VERIO test strip daily. 11/08/20   [provider]  ?pantoprazole (PROTONIX) 40 MG tablet 1 tablet ?Patient not taking: Reported on 07/15/2021    [provider]  ?predniSONE (STERAPRED UNI-PAK 21 TAB) 10 MG (21) TBPK tablet Take by mouth. ?Patient not taking: Reported on 07/15/2021 06/02/21   [provider]  ?PROAIR HFA 108 (90 Base) MCG/ACT inhaler Inhale 2 puffs into the lungs every 4 (four) hours as needed for wheezing or shortness of breath. 07/15/21   Ellison, Chi Jane, MD  ?   ? ?Allergies    ?Patient has no known allergies.   ? ?Review of Systems   ?Review of Systems  ?Constitutional:  Negative for chills and fever.  ?Respiratory:  Negative for shortness of breath.   ?Cardiovascular:  Negative for chest pain.  ?Gastrointestinal:  Positive for abdominal pain, diarrhea (resolved since yesterday), nausea (resolved since yesterday) and vomiting (resolved since yesterday).  ?Genitourinary:  Negative for dysuria.  ?All other systems   reviewed and are negative. ? ?Physical Exam ?Updated Vital Signs ?BP (!) 140/97 (BP Location: Right Arm)   Pulse (!) 102   Temp 98.8 ?F (37.1 ?C) (Oral)   Resp 16   SpO2 96%  ?Physical Exam ?Vitals and nursing note reviewed.  ?Constitutional:   ?   General: He is not in acute distress. ?   Appearance: Normal appearance. He is well-developed and normal weight. He is not ill-appearing, toxic-appearing or diaphoretic.  ?HENT:  ?   Head: Normocephalic and atraumatic.   ?Cardiovascular:  ?   Rate and Rhythm: Normal rate and regular rhythm.  ?   Heart sounds: Normal heart sounds.  ?Pulmonary:  ?   Effort: Pulmonary effort is normal. No respiratory distress.  ?   Breath sounds: Normal breath sounds.  ?Abdominal:  ?   General: Bowel sounds are normal.  ?   Palpations: Abdomen is soft.  ?   Tenderness: There is generalized abdominal tenderness.  ?Musculoskeletal:     ?   General: Normal range of motion.  ?   Cervical back: Normal range of motion.  ?Skin: ?   General: Skin is warm and dry.  ?Neurological:  ?   General: No focal deficit present.  ?   Mental Status: He is alert.  ?Psychiatric:     ?   Mood and Affect: Mood normal.     ?   Behavior: Behavior normal.  ? ? ?ED Results / Procedures / Treatments   ?Labs ?(all labs ordered are listed, but only abnormal results are displayed) ?Labs Reviewed  ?COMPREHENSIVE METABOLIC PANEL - Abnormal; Notable for the following components:  ?    Result Value  ? Glucose, Bld 116 (*)   ? BUN <5 (*)   ? AST 61 (*)   ? ALT 55 (*)   ? All other components within normal limits  ?CBC - Abnormal; Notable for the following components:  ? MCV 73.8 (*)   ? MCH 23.9 (*)   ? RDW 16.5 (*)   ? All other components within normal limits  ?URINALYSIS, ROUTINE W REFLEX MICROSCOPIC - Abnormal; Notable for the following components:  ? Color, Urine STRAW (*)   ? Specific Gravity, Urine 1.002 (*)   ? All other components within normal limits  ?LIPASE, BLOOD  ? ? ?EKG ?None ? ?Radiology ?CT ABDOMEN PELVIS W CONTRAST ? ?Result Date: 01/22/2022 ?CLINICAL DATA:  Acute abdominal pain. EXAM: CT ABDOMEN AND PELVIS WITH CONTRAST TECHNIQUE: Multidetector CT imaging of the abdomen and pelvis was performed using the standard protocol following bolus administration of intravenous contrast. RADIATION DOSE REDUCTION: This exam was performed according to the departmental dose-optimization program which includes automated exposure control, adjustment of the mA and/or kV according to  patient size and/or use of iterative reconstruction technique. CONTRAST:  124m OMNIPAQUE IOHEXOL 300 MG/ML  SOLN COMPARISON:  None Available. FINDINGS: Lower chest: Bibasilar scarring changes but no infiltrates or worrisome pulmonary lesions. The heart is within normal limits in size. No pericardial effusion. The distal esophagus is grossly normal. Hepatobiliary: Diffuse and fairly marked fatty infiltration of the liver but no hepatic lesions or intrahepatic biliary dilatation. The gallbladder is unremarkable. No common bile duct dilatation. Pancreas: No mass, inflammation or ductal dilatation. Spleen: Normal size.  No focal lesions. Adrenals/Urinary Tract: Adrenal glands are normal. No renal calculi, renal lesions or hydronephrosis. No collecting system abnormalities on the delayed images. The bladder is. Stomach/Bowel: The stomach, duodenum, small and colon are unremarkable. No acute inflammatory process,  mass lesions or obstructive findings. The terminal ileum and appendix are normal. Vascular/Lymphatic: Minimal scattered aortic and branch vessel calcifications but no aneurysm or dissection. The major venous structures are normal. No mesenteric or retroperitoneal mass or adenopathy. Reproductive: Mildly enlarged prostate gland. The seminal vesicles are unremarkable. Other: Small periumbilical abdominal wall hernia containing fat. No inguinal hernia. Musculoskeletal: The bony structures unremarkable. IMPRESSION: 1. No acute abdominal/pelvic findings, mass lesions or adenopathy. 2. Diffuse and fairly marked fatty infiltration of the liver. 3. Bibasilar scarring changes. Electronically Signed   By: P.  Gallerani M.D.   On: 01/22/2022 10:40   ? ?Procedures ?Procedures  ? ? ?Medications Ordered in ED ?Medications  ?sodium chloride 0.9 % bolus 500 mL (0 mLs Intravenous Stopped 01/22/22 1121)  ?iohexol (OMNIPAQUE) 300 MG/ML solution 100 mL (100 mLs Intravenous Contrast Given 01/22/22 1019)  ?alum & mag hydroxide-simeth  (MAALOX/MYLANTA) 200-200-20 MG/5ML suspension 30 mL (30 mLs Oral Given 01/22/22 1120)  ?  And  ?lidocaine (XYLOCAINE) 2 % viscous mouth solution 15 mL (15 mLs Oral Given 01/22/22 1120)  ?dicyclomine (BENTYL) capsule 10 m

## 2022-01-22 NOTE — Discharge Instructions (Signed)
As we discussed, your work-up in the ER today was reassuring for acute abnormalities.  Your CT scan was negative for any emergent concerns.  As you had improvement in your symptoms with medications that were given to you in the ER today, I have given you a prescription for both of these to take as prescribed.  Please take the Prilosec 30 minutes prior to your biggest meal of the day.  I also highly recommend that you schedule an appointment with your GI doctor for further evaluation and management of your symptoms and also schedule an appointment for a colonoscopy.  Additionally, as we discussed, if you are having issues with constipation there are many options that you can get over-the-counter including Metamucil and MiraLAX.  You can take these as prescribed on the bottle to assist with regular bowel movements as needed. ? ?Return if development of any new or worsening symptoms. ?

## 2022-01-25 ENCOUNTER — Telehealth: Payer: Self-pay | Admitting: Cardiovascular Disease

## 2022-01-25 NOTE — Telephone Encounter (Signed)
? ?  Pre-operative Risk Assessment  ?  ?Patient Name: Antonio Lee  ?DOB: 12/31/1960 ?MRN: [id number]  ? ?  ? ?Request for Surgical Clearance   ? ?Procedure:  Right knee Arthroscopic partial lateral Meni ? ?Date of Surgery:  Clearance TBD                              ?   ?Surgeon:  Dr. Duwayne Heck ?Surgeon's Group or Practice Name:  [facility] ?Phone number:  [phone] ?Fax number:  [phone] ?  ?Type of Clearance Requested:   ?- Medical  ?  ?Type of Anesthesia:  Not Indicated ?  ?Additional requests/questions:   ? ?Signed, ?Pilar A Ham   ?01/25/2022, 12:13 PM   ?

## 2022-01-26 NOTE — Telephone Encounter (Signed)
    Name: Antonio Lee  DOB: 11/02/1960  MRN: [id number]  Primary Cardiologist: Dr. Rockey Situ - last OV 04/2021   Preoperative team, please contact this patient and set up a phone call appointment for further preoperative risk assessment. Please obtain consent and complete medication review. Thank you for your help.  I confirm that guidance regarding antiplatelet and oral anticoagulation therapy has been completed and, if necessary, noted below.    Charlie Pitter, PA-C 01/26/2022, 11:53 AM Amber [address] [place], [place] [address]

## 2022-01-30 NOTE — Telephone Encounter (Signed)
Left message to call back for tele pre op appt 

## 2022-02-01 ENCOUNTER — Telehealth: Payer: Self-pay | Admitting: *Deleted

## 2022-02-01 NOTE — Telephone Encounter (Signed)
Pt agreeable to tele pre op appt 02/07/22 @ 9 am. Med rec and consent are done.

## 2022-02-01 NOTE — Telephone Encounter (Signed)
Pt agreeable to tele pre op appt 02/07/22 @ 9 am. Med rec and consent are done.   Patient Consent for Virtual Visit        Antonio Lee has provided verbal consent on 02/01/2022 for a virtual visit (video or telephone).   CONSENT FOR VIRTUAL VISIT FOR:  Antonio Lee  By participating in this virtual visit I agree to the following:  I hereby voluntarily request, consent and authorize [facility] and its employed or contracted physicians, physician assistants, nurse practitioners or other licensed health care professionals (the Practitioner), to provide me with telemedicine health care services (the "Services") as deemed necessary by the treating Practitioner. I acknowledge and consent to receive the Services by the Practitioner via telemedicine. I understand that the telemedicine visit will involve communicating with the Practitioner through live audiovisual communication technology and the disclosure of certain medical information by electronic transmission. I acknowledge that I have been given the opportunity to request an in-person assessment or other available alternative prior to the telemedicine visit and am voluntarily participating in the telemedicine visit.  I understand that I have the right to withhold or withdraw my consent to the use of telemedicine in the course of my care at any time, without affecting my right to future care or treatment, and that the Practitioner or I may terminate the telemedicine visit at any time. I understand that I have the right to inspect all information obtained and/or recorded in the course of the telemedicine visit and may receive copies of available information for a reasonable fee.  I understand that some of the potential risks of receiving the Services via telemedicine include:  Delay or interruption in medical evaluation due to technological equipment failure or disruption; Information transmitted may not be sufficient (e.g. poor resolution of  images) to allow for appropriate medical decision making by the Practitioner; and/or  In rare instances, security protocols could fail, causing a breach of personal health information.  Furthermore, I acknowledge that it is my responsibility to provide information about my medical history, conditions and care that is complete and accurate to the best of my ability. I acknowledge that Practitioner's advice, recommendations, and/or decision may be based on factors not within their control, such as incomplete or inaccurate data provided by me or distortions of diagnostic images or specimens that may result from electronic transmissions. I understand that the practice of medicine is not an exact science and that Practitioner makes no warranties or guarantees regarding treatment outcomes. I acknowledge that a copy of this consent can be made available to me via my patient portal ([facility]), or I can request a printed copy by calling the office of [facility].    I understand that my insurance will be billed for this visit.   I have read or had this consent read to me. I understand the contents of this consent, which adequately explains the benefits and risks of the Services being provided via telemedicine.  I have been provided ample opportunity to ask questions regarding this consent and the Services and have had my questions answered to my satisfaction. I give my informed consent for the services to be provided through the use of telemedicine in my medical care

## 2022-02-01 NOTE — Telephone Encounter (Signed)
Patient returned call

## 2022-02-01 NOTE — Telephone Encounter (Signed)
Left message for the pt to call the office to schedule a tele pre op appt 

## 2022-02-02 ENCOUNTER — Other Ambulatory Visit: Payer: Self-pay | Admitting: Internal Medicine

## 2022-02-02 ENCOUNTER — Ambulatory Visit
Admission: RE | Admit: 2022-02-02 | Discharge: 2022-02-02 | Disposition: A | Discharge status: Home / Self Care | Payer: Medicaid Other | Source: Ambulatory Visit | Attending: Internal Medicine | Admitting: Internal Medicine

## 2022-02-02 DIAGNOSIS — M5126 Other intervertebral disc displacement, lumbar region: Secondary | ICD-10-CM

## 2022-02-07 ENCOUNTER — Ambulatory Visit (INDEPENDENT_AMBULATORY_CARE_PROVIDER_SITE_OTHER): Payer: Medicaid Other | Admitting: Nurse Practitioner

## 2022-02-07 DIAGNOSIS — Z01810 Encounter for preprocedural cardiovascular examination: Secondary | ICD-10-CM

## 2022-02-07 NOTE — Progress Notes (Addendum)
Virtual Visit via Telephone Note   Because of Antonio Lee co-morbid illnesses, he is at least at moderate risk for complications without adequate follow up.  This format is felt to be most appropriate for this patient at this time.  The patient did not have access to video technology/had technical difficulties with video requiring transitioning to audio format only (telephone).  All issues noted in this document were discussed and addressed.  No physical exam could be performed with this format.  Please refer to the patient's chart for his consent to telehealth for [facility].  Evaluation Performed:  Preoperative cardiovascular risk assessment _____________   Date:  02/07/2022   Patient ID:  Antonio Lee, DOB 06-16-1961, MRN [id number] Patient Location:  Home Provider location:   Office  Primary Care Provider:  Scheryl Marten, [place] Primary Cardiologist:  None  Chief Complaint / Patient Profile   61 y.o. y/o male with a h/o CVA, syncope, obstructive sleep apnea who is pending right knee arthroscopic partial lateral meniscus repair and presents today for telephonic preoperative cardiovascular risk assessment.  Past Medical History    Past Medical History:  Diagnosis Date   COVID-19    CVA (cerebral vascular accident) ([place])    Diabetes mellitus, type II ([place])    GERD (gastroesophageal reflux disease)    MI (myocardial infarction) ([place])    Osteoarthritis    Sleep apnea    No past surgical history on file.  Allergies  No Known Allergies  History of Present Illness    Antonio Lee is a 61 y.o. male who presents via audio/video conferencing for a telehealth visit today.  Pt was last seen in cardiology clinic on 04/11/2021 by Dr. Rockey Situ.  At that time Antonio Lee was doing well and was seen for chest pain, shortness of breath, and abnormal EKG following asthma attack at church.  On 05/2021 2D echo was performed to exclude cardiac etiology and revealed normal EF  with no significant valve disease and grade 1 DD. Abnormal EKG revealed early repolarization abnormality that was deemed benign following in office review.  The patient is now pending procedure as outlined above. Since his last visit, he has been doing well with new cardiac complaints.  He is limited by his knee pain however but is able to complete ADLs.  Home Medications    Prior to Admission medications   Medication Sig Start Date End Date Taking? Authorizing Provider  atorvastatin (LIPITOR) 20 MG tablet Take 20 mg by mouth daily. Patient not taking: Reported on 01/22/2022 07/07/21   [provider]  atorvastatin (LIPITOR) 40 MG tablet Take 1 tablet by mouth Once a day Patient not taking: Reported on 01/22/2022 03/07/21     BINAXNOW COVID-19 [place] TEST KIT  05/29/21   [provider]  diclofenac Sodium (VOLTAREN) 1 % GEL Apply 1 application. topically 4 (four) times daily as needed for pain. 08/23/21   [provider]  dicyclomine (BENTYL) 20 MG tablet Take 1 tablet (20 mg total) by mouth 2 (two) times daily. 01/22/22   Smoot, Leary Roca, PA-C  ezetimibe (ZETIA) 10 MG tablet Take 1 tablet (10 mg total) by mouth daily. Patient not taking: Reported on 01/22/2022 04/11/21   Minna Merritts, MD  fluticasone-salmeterol (ADVAIR DISKUS) 250-50 MCG/ACT AEPB Inhale 1 puff into the lungs in the morning and at bedtime. 07/15/21   Margaretha Seeds, MD  gabapentin (NEURONTIN) 100 MG capsule Take 100 mg by mouth daily. 04/03/21   [provider]  lisinopril (ZESTRIL) 20 MG tablet Take 20 mg by mouth daily. 01/20/22   [provider]  metFORMIN (GLUCOPHAGE) 500 MG tablet Take 500 mg by mouth 2 (two) times daily. 12/22/20   [provider]  naproxen sodium (ALEVE) 220 MG tablet Take 220 mg by mouth daily as needed (knee pain).    [provider]  omeprazole (PRILOSEC) 20 MG capsule Take 1 capsule (20 mg total) by mouth daily. 01/22/22 02/21/22  Smoot, Leary Roca,  PA-C  ONETOUCH VERIO test strip daily. 11/08/20   [provider]  pantoprazole (PROTONIX) 40 MG tablet 1 tablet Patient not taking: Reported on 07/15/2021    [provider]  PROAIR HFA 108 (90 Base) MCG/ACT inhaler Inhale 2 puffs into the lungs every 4 (four) hours as needed for wheezing or shortness of breath. 07/15/21   Margaretha Seeds, MD  sildenafil (VIAGRA) 50 MG tablet Take 50 mg by mouth daily as needed for erectile dysfunction. 01/20/22   [provider]  tiZANidine (ZANAFLEX) 4 MG tablet Take 4 mg by mouth 3 (three) times daily. 01/20/22   [provider]    Physical Exam    Vital Signs:  Antonio Lee does not have vital signs available for review today.  None completed today  Given telephonic nature of communication, physical exam is limited. AAOx3. NAD. Normal affect.  Speech and respirations are unlabored.  Accessory Clinical Findings    None  Assessment & Plan    1.  Preoperative Cardiovascular Risk Assessment:  Antonio Lee perioperative risk of a major cardiac event is 0.9% according to the Revised Cardiac Risk Index (RCRI).  Therefore, he is at low risk for perioperative complications.   His functional capacity is good at 4.95 METs according to the Duke Activity Status Index (DASI).  Patient's functional status currently limited by his knee pain.  The patient affirms he has been doing well without any new cardiac symptoms. They are able to achieve 4 METS without cardiac limitations. Therefore, based on ACC/AHA guidelines, the patient would be at acceptable risk for the planned procedure without further cardiovascular testing. The patient was advised that if he develops new symptoms prior to surgery to contact our office to arrange for a follow-up visit, and he verbalized understanding.  Recommendations: According to ACC/AHA guidelines, no further cardiovascular testing needed.  The patient may proceed to surgery at acceptable risk.    Antiplatelet and/or Anticoagulation Recommendations: Patient is not on any blood thinning or anticoagulants at this time therefore no instructions are warranted for upcoming procedure.  A copy of this note will be routed to requesting surgeon.  Time:   Today, I have spent  6 minutes with the patient with telehealth technology discussing medical history, symptoms, and management plan.     Mable Fill, Marissa Nestle, NP  02/07/2022, 7:30 AM

## 2022-02-28 ENCOUNTER — Other Ambulatory Visit: Payer: Self-pay

## 2022-02-28 ENCOUNTER — Emergency Department (HOSPITAL_COMMUNITY)
Admission: EM | Admit: 2022-02-28 | Discharge: 2022-03-01 | Disposition: A | Discharge status: Home / Self Care | Payer: Medicaid Other | Attending: Emergency Medicine | Admitting: Emergency Medicine

## 2022-02-28 ENCOUNTER — Encounter (HOSPITAL_COMMUNITY): Payer: Self-pay

## 2022-02-28 ENCOUNTER — Emergency Department (HOSPITAL_COMMUNITY): Payer: Medicaid Other

## 2022-02-28 DIAGNOSIS — K2921 Alcoholic gastritis with bleeding: Secondary | ICD-10-CM | POA: Insufficient documentation

## 2022-02-28 DIAGNOSIS — K920 Hematemesis: Secondary | ICD-10-CM | POA: Insufficient documentation

## 2022-02-28 LAB — TYPE AND SCREEN
ABO/RH(D): A POS
Antibody Screen: NEGATIVE

## 2022-02-28 LAB — COMPREHENSIVE METABOLIC PANEL
ALT: 41 U/L (ref 0–44)
AST: 46 U/L — ABNORMAL HIGH (ref 15–41)
Albumin: 4.3 g/dL (ref 3.5–5.0)
Alkaline Phosphatase: 78 U/L (ref 38–126)
Anion gap: 13 (ref 5–15)
BUN: 7 mg/dL — ABNORMAL LOW (ref 8–23)
CO2: 26 mmol/L (ref 22–32)
Calcium: 9 mg/dL (ref 8.9–10.3)
Chloride: 102 mmol/L (ref 98–111)
Creatinine, Ser: 0.77 mg/dL (ref 0.61–1.24)
GFR, Estimated: >60 mL/min (ref >60)
Glucose, Bld: 120 mg/dL — ABNORMAL HIGH (ref 70–99)
Potassium: 3.5 mmol/L (ref 3.5–5.1)
Sodium: 141 mmol/L (ref 135–145)
Total Bilirubin: 0.6 mg/dL (ref 0.3–1.2)
Total Protein: 7.6 g/dL (ref 6.5–8.1)

## 2022-02-28 LAB — CBC WITH DIFFERENTIAL/PLATELET
Abs Immature Granulocytes: 0.02 10*3/uL (ref 0.00–0.07)
Basophils Absolute: 0 10*3/uL (ref 0.0–0.1)
Basophils Relative: 0 %
Eosinophils Absolute: 0.1 10*3/uL (ref 0.0–0.5)
Eosinophils Relative: 1 %
HCT: 44.9 % (ref 39.0–52.0)
Hemoglobin: 14.1 g/dL (ref 13.0–17.0)
Immature Granulocytes: 0 %
Lymphocytes Relative: 49 %
Lymphs Abs: 3 10*3/uL (ref 0.7–4.0)
MCH: 23.8 pg — ABNORMAL LOW (ref 26.0–34.0)
MCHC: 31.4 g/dL (ref 30.0–36.0)
MCV: 75.7 fL — ABNORMAL LOW (ref 80.0–100.0)
Monocytes Absolute: 0.3 10*3/uL (ref 0.1–1.0)
Monocytes Relative: 5 %
Neutro Abs: 2.8 10*3/uL (ref 1.7–7.7)
Neutrophils Relative %: 45 %
Platelets: 281 10*3/uL (ref 150–400)
RBC: 5.93 MIL/uL — ABNORMAL HIGH (ref 4.22–5.81)
RDW: 14.9 % (ref 11.5–15.5)
WBC: 6.2 10*3/uL (ref 4.0–10.5)
nRBC: 0 % (ref 0.0–0.2)

## 2022-02-28 LAB — TROPONIN I (HIGH SENSITIVITY): Troponin I (High Sensitivity): 6 ng/L (ref <18)

## 2022-02-28 LAB — PROTIME-INR
INR: 1.1 (ref 0.8–1.2)
Prothrombin Time: 13.7 seconds (ref 11.4–15.2)

## 2022-02-28 LAB — APTT: aPTT: 29 seconds (ref 24–36)

## 2022-02-28 NOTE — ED Triage Notes (Signed)
Pt reports spitting up dark emesis that started on Wednesday after having an endoscopy. He started drinking alcohol after that, about a pint of liquor today. Denies taking blood thinners. Pt able to speak clear complete sentences.

## 2022-02-28 NOTE — ED Provider Triage Note (Signed)
Emergency Medicine Provider Triage Evaluation Note  Antonio Lee , a 61 y.o. male  was evaluated in triage.  Pt complains of hemoptysis onset 6 days.  Patient had a right knee arthroscopic procedure completed and started back drinking alcohol after that.  Drinks about a pint of liquor daily.  Has had 3 shots today.  Denies blood thinners.  Denies chest pain, shortness of breath.  Has a past medical history of GERD. Review of Systems  Positive: As per HPI Negative:   Physical Exam  BP (!) 115/91 (BP Location: Left Arm)   Pulse 88   Temp 98.5 F (36.9 C) (Oral)   Resp 18   SpO2 98%  Gen:   Awake, no distress, vomiting up dark emesis Resp:  Normal effort  MSK:   Moves extremities without difficulty  Other:    Medical Decision Making  Medically screening exam initiated at 8:53 PM.  Appropriate orders placed.  Antonio Lee was informed that the remainder of the evaluation will be completed by another provider, this initial triage assessment does not replace that evaluation, and the importance of remaining in the ED until their evaluation is complete.  8:55 PM - Discussed with RN that patient is in need of a room. RN aware and working on room placement.    Chyane Greer A, PA-C 02/28/22 2104

## 2022-03-01 LAB — TROPONIN I (HIGH SENSITIVITY): Troponin I (High Sensitivity): 5 ng/L (ref <18)

## 2022-03-01 MED ORDER — ONDANSETRON 4 MG PO TBDP: ORAL_TABLET | ORAL | 0 refills | Status: AC

## 2022-03-01 MED ORDER — FAMOTIDINE 20 MG PO TABS
40.0000 mg | ORAL_TABLET | Freq: Once | ORAL | Status: AC
  Administered 2022-03-01: 40 mg via ORAL
  Filled 2022-03-01: qty 2

## 2022-03-01 MED ORDER — SUCRALFATE 1 GM/10ML PO SUSP: 1.0000 g | Freq: Three times a day (TID) | ORAL | 0 refills | Status: DC

## 2022-03-01 MED ORDER — PANTOPRAZOLE SODIUM 40 MG PO TBEC: 40.0000 mg | DELAYED_RELEASE_TABLET | Freq: Every day | ORAL | 3 refills | Status: AC

## 2022-03-01 MED ORDER — ONDANSETRON 4 MG PO TBDP
4.0000 mg | ORAL_TABLET | Freq: Once | ORAL | Status: AC
  Administered 2022-03-01: 4 mg via ORAL
  Filled 2022-03-01: qty 1

## 2022-03-01 MED ORDER — PANTOPRAZOLE SODIUM 40 MG PO TBEC
40.0000 mg | DELAYED_RELEASE_TABLET | Freq: Once | ORAL | Status: AC
  Administered 2022-03-01: 40 mg via ORAL
  Filled 2022-03-01: qty 1

## 2022-03-01 NOTE — Discharge Instructions (Signed)
Return to the ER for increased bleeding, increased abdominal pain, weakness, dizziness or passing out.

## 2022-03-01 NOTE — ED Provider Notes (Signed)
[facility] EMERGENCY DEPARTMENT Provider Note   CSN: [id number] Arrival date & time: 02/28/22  2006     History  Chief Complaint  Patient presents with   Emesis    Antonio Lee is a 61 y.o. male.  Patient presents to the emergency department for evaluation of hiccups with hematemesis.  Patient reports that he had knee surgery a week ago and was intubated for the surgery.  He has had throat irritation since.  He has been having intermittent episodes of hiccups, mostly at night.  He does report that he drinks alcohol most days.  No known liver disease.       Home Medications Prior to Admission medications   Medication Sig Start Date End Date Taking? Authorizing Provider  ondansetron (ZOFRAN-ODT) 4 MG disintegrating tablet 82m ODT q4 hours prn nausea/vomit 03/01/22  Yes Jeyson Deshotel, CGwenyth Allegra MD  pantoprazole (PROTONIX) 40 MG tablet Take 1 tablet (40 mg total) by mouth daily. 03/01/22  Yes Yelena Metzer, CGwenyth Allegra MD  sucralfate (CARAFATE) 1 GM/10ML suspension Take 10 mLs (1 g total) by mouth 4 (four) times daily -  with meals and at bedtime. 03/01/22  Yes Amritha Yorke, CGwenyth Allegra MD  atorvastatin (LIPITOR) 20 MG tablet Take 20 mg by mouth daily. Patient not taking: Reported on 01/22/2022 07/07/21   [provider]  atorvastatin (LIPITOR) 40 MG tablet Take 1 tablet by mouth Once a day Patient not taking: Reported on 01/22/2022 03/07/21     BINAXNOW COVID-19 [place] K[place]  05/29/21   [provider]  diclofenac Sodium (VOLTAREN) 1 % GEL Apply 1 application. topically 4 (four) times daily as needed for pain. 08/23/21   [provider]  dicyclomine (BENTYL) 20 MG tablet Take 1 tablet (20 mg total) by mouth 2 (two) times daily. 01/22/22   Smoot, SLeary Roca PA-C  ezetimibe (ZETIA) 10 MG tablet Take 1 tablet (10 mg total) by mouth daily. Patient not taking: Reported on 01/22/2022 04/11/21   GMinna Merritts MD  fluticasone-salmeterol (ADVAIR  DISKUS) 250-50 MCG/ACT AEPB Inhale 1 puff into the lungs in the morning and at bedtime. 07/15/21   EMargaretha Seeds MD  gabapentin (NEURONTIN) 100 MG capsule Take 100 mg by mouth daily. 04/03/21   [provider]  lisinopril (ZESTRIL) 20 MG tablet Take 20 mg by mouth daily. 01/20/22   [provider]  metFORMIN (GLUCOPHAGE) 500 MG tablet Take 500 mg by mouth 2 (two) times daily. 12/22/20   [provider]  naproxen sodium (ALEVE) 220 MG tablet Take 220 mg by mouth daily as needed (knee pain).    [provider]  [facility] test strip daily. 11/08/20   [provider]  PROAIR HFA 108 (90 Base) MCG/ACT inhaler Inhale 2 puffs into the lungs every 4 (four) hours as needed for wheezing or shortness of breath. 07/15/21   EMargaretha Seeds MD  sildenafil (VIAGRA) 50 MG tablet Take 50 mg by mouth daily as needed for erectile dysfunction. 01/20/22   [provider]  tiZANidine (ZANAFLEX) 4 MG tablet Take 4 mg by mouth 3 (three) times daily. 01/20/22   [provider]      Allergies    Patient has no known allergies.    Review of Systems   Review of Systems  Physical Exam Updated Vital Signs BP 135/88   Pulse 70   Temp 98.5 F (36.9 C) (Oral)   Resp 14   Ht 5' 10"  (1.778 m)   Wt  89.8 kg   SpO2 95%   BMI 28.41 kg/m  Physical Exam Vitals and nursing note reviewed.  Constitutional:      General: He is not in acute distress.    Appearance: He is well-developed.  HENT:     Head: Normocephalic and atraumatic.     Mouth/Throat:     Mouth: Mucous membranes are moist.  Eyes:     General: Vision grossly intact. Gaze aligned appropriately.     Extraocular Movements: Extraocular movements intact.     Conjunctiva/sclera: Conjunctivae normal.  Cardiovascular:     Rate and Rhythm: Normal rate and regular rhythm.     Pulses: Normal pulses.     Heart sounds: Normal heart sounds, S1 normal and S2 normal. No murmur heard.    No friction  rub. No gallop.  Pulmonary:     Effort: Pulmonary effort is normal. No respiratory distress.     Breath sounds: Normal breath sounds.  Abdominal:     Palpations: Abdomen is soft.     Tenderness: There is no abdominal tenderness. There is no guarding or rebound.     Hernia: No hernia is present.  Musculoskeletal:        General: No swelling.     Cervical back: Full passive range of motion without pain, normal range of motion and neck supple. No pain with movement, spinous process tenderness or muscular tenderness. Normal range of motion.     Right lower leg: No edema.     Left lower leg: No edema.  Skin:    General: Skin is warm and dry.     Capillary Refill: Capillary refill takes less than 2 seconds.     Findings: No ecchymosis, erythema, lesion or wound.  Neurological:     Mental Status: He is alert and oriented to person, place, and time.     GCS: GCS eye subscore is 4. GCS verbal subscore is 5. GCS motor subscore is 6.     Cranial Nerves: Cranial nerves 2-12 are intact.     Sensory: Sensation is intact.     Motor: Motor function is intact. No weakness or abnormal muscle tone.     Coordination: Coordination is intact.  Psychiatric:        Mood and Affect: Mood normal.        Speech: Speech normal.        Behavior: Behavior normal.     ED Results / Procedures / Treatments   Labs (all labs ordered are listed, but only abnormal results are displayed) Labs Reviewed  CBC WITH DIFFERENTIAL/PLATELET - Abnormal; Notable for the following components:      Result Value   RBC 5.93 (*)    MCV 75.7 (*)    MCH 23.8 (*)    All other components within normal limits  COMPREHENSIVE METABOLIC PANEL - Abnormal; Notable for the following components:   Glucose, Bld 120 (*)    BUN 7 (*)    AST 46 (*)    All other components within normal limits  APTT  PROTIME-INR  TYPE AND SCREEN  TROPONIN I (HIGH SENSITIVITY)  TROPONIN I (HIGH SENSITIVITY)    EKG EKG  Interpretation  Date/Time:  Tuesday February 28 2022 21:11:01 EDT Ventricular Rate:  93 PR Interval:  178 QRS Duration: 78 QT Interval:  368 QTC Calculation: 457 R Axis:   47 Text Interpretation: Normal sinus rhythm Possible Left atrial enlargement Borderline ECG No significant change since last tracing Confirmed by Orpah Greek [phone]) on  03/01/2022 5:37:43 AM  Radiology DG Chest 2 View  Result Date: 02/28/2022 CLINICAL DATA:  Recent knee scope with subsequent hiccups and hemoptysis. EXAM: CHEST - 2 VIEW COMPARISON:  April 04, 2021 FINDINGS: The heart size and mediastinal contours are within normal limits. Mild, stable linear scarring is seen within the mid right lung and bilateral lung bases. Stable lingular versus anterior right middle lobe volume loss is seen on the lateral view. There is no evidence of acute infiltrate, pleural effusion or pneumothorax. The visualized skeletal structures are unremarkable. IMPRESSION: Stable bibasilar linear scarring without evidence of acute or active cardiopulmonary disease. Electronically Signed   By: Virgina Norfolk M.D.   On: 02/28/2022 21:35    Procedures Procedures    Medications Ordered in ED Medications  ondansetron (ZOFRAN-ODT) disintegrating tablet 4 mg (4 mg Oral Given 03/01/22 0046)  famotidine (PEPCID) tablet 40 mg (40 mg Oral Given 03/01/22 0658)  pantoprazole (PROTONIX) EC tablet 40 mg (40 mg Oral Given 03/01/22 4473)    ED Course/ Medical Decision Making/ A&P                           Medical Decision Making  Patient presents to the emergency department stating that he has been having intermittent hiccups at night and tonight has had emesis.  Patient reports that there has been some blood in the emesis.  Patient does admit to frequent if not daily alcohol intake but does not have any prior history of cirrhosis or varices.  Patient was given Zofran and her has not been any vomiting since.  Patient's vital signs are normal.   Hemoglobin is stable.  LFTs, pro time are normal.  Treat for alcoholic gastritis, given return precautions for increased bleeding.  Follow-up with GI.        Final Clinical Impression(s) / ED Diagnoses Final diagnoses:  Hematemesis with nausea  Acute alcoholic gastritis with hemorrhage    Rx / DC Orders ED Discharge Orders          Ordered    sucralfate (CARAFATE) 1 GM/10ML suspension  3 times daily with meals & bedtime        03/01/22 0657    pantoprazole (PROTONIX) 40 MG tablet  Daily        03/01/22 0657    ondansetron (ZOFRAN-ODT) 4 MG disintegrating tablet        03/01/22 0657              Orpah Greek, MD 03/01/22 [id number]

## 2022-04-24 ENCOUNTER — Other Ambulatory Visit (HOSPITAL_COMMUNITY): Payer: Self-pay

## 2022-04-28 ENCOUNTER — Other Ambulatory Visit (HOSPITAL_COMMUNITY): Payer: Self-pay

## 2022-05-07 ENCOUNTER — Other Ambulatory Visit: Payer: Self-pay

## 2022-05-07 ENCOUNTER — Ambulatory Visit (HOSPITAL_COMMUNITY)
Admission: EM | Admit: 2022-05-07 | Discharge: 2022-05-07 | Disposition: A | Discharge status: Home / Self Care | Payer: Medicaid Other

## 2022-05-07 ENCOUNTER — Encounter (HOSPITAL_COMMUNITY): Payer: Self-pay | Admitting: Emergency Medicine

## 2022-05-07 ENCOUNTER — Ambulatory Visit (INDEPENDENT_AMBULATORY_CARE_PROVIDER_SITE_OTHER): Payer: Medicaid Other

## 2022-05-07 DIAGNOSIS — S838X1A Sprain of other specified parts of right knee, initial encounter: Secondary | ICD-10-CM | POA: Diagnosis not present

## 2022-05-07 DIAGNOSIS — M25561 Pain in right knee: Secondary | ICD-10-CM

## 2022-05-07 MED ORDER — HYDROCODONE-ACETAMINOPHEN 5-325 MG PO TABS: 1.0000 | ORAL_TABLET | Freq: Two times a day (BID) | ORAL | 0 refills | Status: DC | PRN

## 2022-05-07 NOTE — ED Triage Notes (Signed)
Had a knee procedure done February 22, 2022.  Patient reports twisting this knee last night.  It is the right knee.

## 2022-05-07 NOTE — ED Provider Notes (Signed)
[facility]    CSN: [id number] Arrival date & time: 05/07/22  1449      History   Chief Complaint Chief Complaint  Patient presents with   Knee Pain    HPI Antonio Lee is a 61 y.o. male.   Patient presents today with a 1 day history of right knee pain.  Reports that he went to go catch his grandson when he twisted causing severe pain in his lateral right knee that has been ongoing.  Pain is minimal at rest and in a flexed position but worsened significantly with attempted ambulation or extension of the knee, described as sharp, rated 10 on a 0-10 pain scale, no alleviating factors notified.  He does report having arthroscopic procedure on this knee on 02/22/2022 but has not had any arthroplasty.  He is not taking any over-the-counter medications as he does not tolerate Tylenol or NSAIDs.  He does report some numbness and paresthesias in the right foot but states this is at baseline and comparable to his left foot related to ongoing neuropathy.  He denies any worsening numbness or paresthesias.  He is having difficulty ambulating as bearing weight increases pain.    Past Medical History:  Diagnosis Date   COVID-19    CVA (cerebral vascular accident) ([place])    Diabetes mellitus, type II ([place])    GERD (gastroesophageal reflux disease)    MI (myocardial infarction) ([place])    Osteoarthritis    Sleep apnea     Patient Active Problem List   Diagnosis Date Noted   COVID-19 long hauler manifesting chronic dyspnea 07/15/2021   Chronic cough 07/15/2021   Moderate persistent asthma without complication 40/98/1191    Past Surgical History:  Procedure Laterality Date   KNEE SURGERY Right        Home Medications    Prior to Admission medications   Medication Sig Start Date End Date Taking? Authorizing Provider  HYDROcodone-acetaminophen (NORCO/VICODIN) 5-325 MG tablet Take 1 tablet by mouth 2 (two) times daily as needed for up to 2 days. 05/07/22 05/09/22 Yes Marijke Guadiana,  Eiliyah Reh K, PA-C  atorvastatin (LIPITOR) 20 MG tablet Take 20 mg by mouth daily. Patient not taking: Reported on 01/22/2022 07/07/21   [provider]  atorvastatin (LIPITOR) 40 MG tablet Take 1 tablet by mouth Once a day Patient not taking: Reported on 01/22/2022 03/07/21     BINAXNOW COVID-19 [place] TEST KIT  05/29/21   [provider]  diclofenac Sodium (VOLTAREN) 1 % GEL Apply 1 application. topically 4 (four) times daily as needed for pain. 08/23/21   [provider]  dicyclomine (BENTYL) 20 MG tablet Take 1 tablet (20 mg total) by mouth 2 (two) times daily. 01/22/22   Smoot, Leary Roca, PA-C  empagliflozin (JARDIANCE) 10 MG TABS tablet TAKE 1 TABLET BY MOUTH ONCE DAILY FOR 90 DAYS    [provider]  ezetimibe (ZETIA) 10 MG tablet Take 1 tablet (10 mg total) by mouth daily. Patient not taking: Reported on 01/22/2022 04/11/21   Minna Merritts, MD  fluticasone-salmeterol (ADVAIR DISKUS) 250-50 MCG/ACT AEPB Inhale 1 puff into the lungs in the morning and at bedtime. 07/15/21   Margaretha Seeds, MD  gabapentin (NEURONTIN) 100 MG capsule Take 100 mg by mouth daily. Patient not taking: Reported on 05/07/2022 04/03/21   [provider]  lisinopril (ZESTRIL) 20 MG tablet Take 20 mg by mouth daily. 01/20/22   [provider]  metFORMIN (GLUCOPHAGE) 500 MG tablet Take 500 mg  by mouth 2 (two) times daily. Patient not taking: Reported on 05/07/2022 12/22/20   [provider]  naproxen sodium (ALEVE) 220 MG tablet Take 220 mg by mouth daily as needed (knee pain). Patient not taking: Reported on 05/07/2022    [provider]  ondansetron (ZOFRAN-ODT) 4 MG disintegrating tablet 79m ODT q4 hours prn nausea/vomit 03/01/22   Pollina, CGwenyth Allegra MD  [facility] daily. 11/08/20   [provider]  pantoprazole (PROTONIX) 40 MG tablet Take 1 tablet (40 mg total) by mouth daily. 03/01/22   POrpah Greek MD  PROAIR HFA 108  [phone]) MCG/ACT inhaler Inhale 2 puffs into the lungs every 4 (four) hours as needed for wheezing or shortness of breath. 07/15/21   EMargaretha Seeds MD  sildenafil (VIAGRA) 50 MG tablet Take 50 mg by mouth daily as needed for erectile dysfunction. 01/20/22   [provider]  sucralfate (CARAFATE) 1 GM/10ML suspension Take 10 mLs (1 g total) by mouth 4 (four) times daily -  with meals and at bedtime. 03/01/22   POrpah Greek MD  tiZANidine (ZANAFLEX) 4 MG tablet Take 4 mg by mouth 3 (three) times daily. Patient not taking: Reported on 05/07/2022 01/20/22   [provider]    Family History Family History  Problem Relation Age of Onset   Hypertension Mother    Lupus Mother    Heart attack Father 759  Hyperlipidemia Father    Hypertension Father    Diabetes Father    Heart attack Paternal Aunt        CABG   Heart disease Paternal Aunt    Stroke Paternal Grandfather     Social History Social History   Tobacco Use   Smoking status: Former    Packs/day: 1.00    Years: 30.00    Total pack years: 30.00    Types: Cigarettes    Quit date: 2012    Years since quitting: 11.6  Vaping Use   Vaping Use: Never used  Substance Use Topics   Alcohol use: Yes    Comment: social drinker   Drug use: Not Currently     Allergies   Patient has no known allergies.   Review of Systems Review of Systems  Constitutional:  Positive for activity change. Negative for appetite change, fatigue and fever.  Musculoskeletal:  Positive for arthralgias, gait problem and joint swelling. Negative for myalgias.  Skin:  Negative for color change and wound.  Neurological:  Positive for numbness (chronic). Negative for weakness.     Physical Exam Triage Vital Signs ED Triage Vitals  Enc Vitals Group     BP 05/07/22 1549 133/84     Pulse Rate 05/07/22 1549 82     Resp 05/07/22 1549 18     Temp 05/07/22 1549 98.4 F (36.9 C)     Temp Source 05/07/22 1549 Oral     SpO2  05/07/22 1549 95 %     Weight --      Height --      Head Circumference --      Peak Flow --      Pain Score 05/07/22 1545 10     Pain Loc --      Pain Edu? --      Excl. in [place] --    No data found.  Updated Vital Signs BP 133/84 (BP Location: Left Arm)   Pulse 82   Temp 98.4 F (36.9 C) (Oral)  Resp 18   SpO2 95%   Visual Acuity Right Eye Distance:   Left Eye Distance:   Bilateral Distance:    Right Eye Near:   Left Eye Near:    Bilateral Near:     Physical Exam Vitals reviewed.  Constitutional:      General: He is awake.     Appearance: Normal appearance. He is well-developed. He is not ill-appearing.     Comments: Very pleasant male appears stated age sitting on exam room table in no acute distress  HENT:     Head: Normocephalic and atraumatic.     Mouth/Throat:     Pharynx: No oropharyngeal exudate, posterior oropharyngeal erythema or uvula swelling.  Cardiovascular:     Rate and Rhythm: Normal rate and regular rhythm.     Pulses:          Posterior tibial pulses are 2+ on the right side and 2+ on the left side.     Heart sounds: Normal heart sounds, S1 normal and S2 normal. No murmur heard. Pulmonary:     Effort: Pulmonary effort is normal.     Breath sounds: Normal breath sounds. No stridor. No wheezing, rhonchi or rales.     Comments: Clear to auscultation bilaterally Musculoskeletal:     Right knee: Swelling and bony tenderness present. Decreased range of motion. Tenderness present over the lateral joint line. No medial joint line tenderness. No LCL laxity, MCL laxity, ACL laxity or PCL laxity.     Comments: Right knee: Tender palpation over inferior lateral joint line.  No deformity noted.  Decreased range of motion with flexion beyond 90 degrees or extension.  No varus/valgus or anterior/posterior laxity.  Patient unable to tolerate [place].  Neurological:     Mental Status: He is alert.  Psychiatric:        Behavior: Behavior is cooperative.       UC Treatments / Results  Labs (all labs ordered are listed, but only abnormal results are displayed) Labs Reviewed - No data to display  EKG   Radiology DG Knee Complete 4 Views Right  Result Date: 05/07/2022 CLINICAL DATA:  RIGHT knee pain following injury. Initial encounter. EXAM: RIGHT KNEE - COMPLETE 4 VIEW COMPARISON:  08/23/2021 radiograph FINDINGS: There is no evidence of acute fracture, subluxation or dislocation. No joint effusion is noted. Mild MEDIAL and patellofemoral compartment degenerative changes again noted. No focal bony lesions are present. IMPRESSION: No acute abnormality. Electronically Signed   By: Margarette Canada M.D.   On: 05/07/2022 16:42    Procedures Procedures (including critical care time)  Medications Ordered in UC Medications - No data to display  Initial Impression / Assessment and Plan / UC Course  I have reviewed the triage vital signs and the nursing notes.  Pertinent labs & imaging results that were available during my care of the patient were reviewed by me and considered in my medical decision making (see chart for details).     X-ray obtained given severity of pain with decreasing to motion that showed no osseous abnormality.  Concern for meniscal injury given clinical presentation and mechanism of injury.  Patient was placed in a brace for comfort and support.  Recommended he limit weightbearing and use crutches.  He reports that he has these at home and will use them.  Recommended conservative treatment measures including RICE protocol.  Discussed pain management options with patient and he reports that he is unable to take Tylenol, NSAIDs, tramadol.  He has successfully  been treated with hydrocodone in the past.  Discussed that we can only provide a few doses of this medication he should follow-up quickly with orthopedics.  Review of [place] controlled substance database shows no inappropriate refills.  4 tablets of hydrocodone sent to  pharmacy.  Discussed that this is sedating and he should not drive or drink alcohol taking this medication.  Discussed the importance of following up with orthopedics.  He is to call them first thing tomorrow to schedule an appointment.  If he has any worsening symptoms he is to return for reevaluation.  Strict return precautions given.  Work excuse note provided.  Final Clinical Impressions(s) / UC Diagnoses   Final diagnoses:  Acute lateral meniscal injury of right knee, initial encounter  Acute pain of right knee     Discharge Instructions      Your x-ray was normal.  I am very concerned about a meniscal injury.  Since you cannot take Tylenol, ibuprofen, tramadol I have called in a few doses of hydrocodone.  This is sedating and potentially addictive so try to use this as sparingly as possible.  Keep your leg elevated and use ice.  Try to avoid bearing weight and use the brace for additional comfort.  You should follow-up with your orthopedic surgeon as soon as possible.  Call them to schedule an appointment tomorrow.  If you have any worsening symptoms he should return for reevaluation.     ED Prescriptions     Medication Sig Dispense Auth. Provider   HYDROcodone-acetaminophen (NORCO/VICODIN) 5-325 MG tablet Take 1 tablet by mouth 2 (two) times daily as needed for up to 2 days. 4 tablet Mea Ozga K, PA-C      I have reviewed the PDMP during this encounter.   Terrilee Croak, PA-C 05/07/22 1653

## 2022-05-07 NOTE — Discharge Instructions (Signed)
Your x-ray was normal.  I am very concerned about a meniscal injury.  Since you cannot take Tylenol, ibuprofen, tramadol I have called in a few doses of hydrocodone.  This is sedating and potentially addictive so try to use this as sparingly as possible.  Keep your leg elevated and use ice.  Try to avoid bearing weight and use the brace for additional comfort.  You should follow-up with your orthopedic surgeon as soon as possible.  Call them to schedule an appointment tomorrow.  If you have any worsening symptoms he should return for reevaluation.

## 2022-05-08 ENCOUNTER — Telehealth (HOSPITAL_COMMUNITY): Payer: Self-pay | Admitting: Family Medicine

## 2022-05-08 ENCOUNTER — Telehealth (HOSPITAL_COMMUNITY): Payer: Self-pay | Admitting: Internal Medicine

## 2022-05-08 MED ORDER — HYDROCODONE-ACETAMINOPHEN 5-325 MG PO TABS: 1.0000 | ORAL_TABLET | Freq: Four times a day (QID) | ORAL | 0 refills | Status: DC | PRN

## 2022-05-08 NOTE — Telephone Encounter (Signed)
Pharmacy confirmed below Rx not sent. I re-sent.  Meds ordered this encounter  Medications   HYDROcodone-acetaminophen (NORCO/VICODIN) 5-325 MG tablet    Sig: Take 1 tablet by mouth every 6 (six) hours as needed for moderate pain or severe pain.    Dispense:  4 tablet    Refill:  0

## 2022-05-08 NOTE — Telephone Encounter (Signed)
Medication printed for patient to pick up

## 2022-05-23 ENCOUNTER — Other Ambulatory Visit (HOSPITAL_COMMUNITY): Payer: Self-pay

## 2022-05-23 MED ORDER — ATORVASTATIN CALCIUM 40 MG PO TABS
40.0000 mg | ORAL_TABLET | Freq: Every day | ORAL | 3 refills | Status: DC
  Filled 2022-05-23: qty 30, 30d supply, fill #0

## 2022-05-31 ENCOUNTER — Other Ambulatory Visit (HOSPITAL_COMMUNITY): Payer: Self-pay

## 2022-06-14 DIAGNOSIS — E119 Type 2 diabetes mellitus without complications: Secondary | ICD-10-CM | POA: Diagnosis not present

## 2022-06-14 DIAGNOSIS — H524 Presbyopia: Secondary | ICD-10-CM | POA: Diagnosis not present

## 2022-07-03 DIAGNOSIS — G629 Polyneuropathy, unspecified: Secondary | ICD-10-CM | POA: Diagnosis not present

## 2022-07-03 DIAGNOSIS — Z8619 Personal history of other infectious and parasitic diseases: Secondary | ICD-10-CM | POA: Diagnosis not present

## 2022-07-03 DIAGNOSIS — Z23 Encounter for immunization: Secondary | ICD-10-CM | POA: Diagnosis not present

## 2022-07-03 DIAGNOSIS — G4733 Obstructive sleep apnea (adult) (pediatric): Secondary | ICD-10-CM | POA: Diagnosis not present

## 2022-07-03 DIAGNOSIS — I10 Essential (primary) hypertension: Secondary | ICD-10-CM | POA: Diagnosis not present

## 2022-07-03 DIAGNOSIS — D170 Benign lipomatous neoplasm of skin and subcutaneous tissue of head, face and neck: Secondary | ICD-10-CM | POA: Diagnosis not present

## 2022-07-03 DIAGNOSIS — E1165 Type 2 diabetes mellitus with hyperglycemia: Secondary | ICD-10-CM | POA: Diagnosis not present

## 2022-07-03 DIAGNOSIS — E782 Mixed hyperlipidemia: Secondary | ICD-10-CM | POA: Diagnosis not present

## 2022-07-05 ENCOUNTER — Ambulatory Visit: Payer: Medicare Other | Admitting: Podiatry

## 2022-07-14 IMAGING — MR MR WRIST*L* W/O CM
6 series · 40 of 40 positions shown · non-contrast
Comparison: None.
COMPARISON: None.

Addendum:
CLINICAL DATA: Left wrist pain. Pain along the radial aspect. No
known injury.

EXAM:
MR OF THE LEFT WRIST WITHOUT CONTRAST
TECHNIQUE: Multiplanar, multisequence MR imaging of the left wrist was
performed. No intravenous contrast was administered.

[Series 5: T2 fat-sat · axial · left · 3.0mm · 0.34mm/px · z∈[-43,+34]mm · 8 of 25 slices shown (1 of 2)]
[im 1/25]
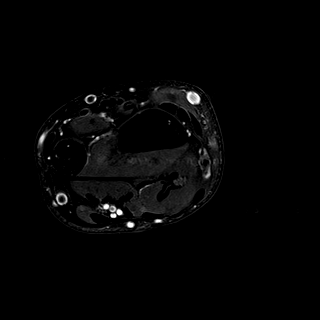
[im 4/25]
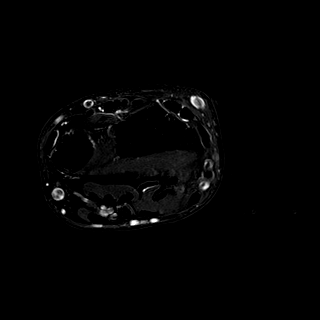
[im 7/25]
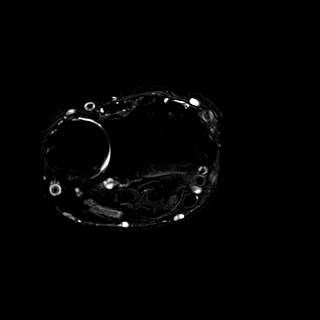
[im 11/25]
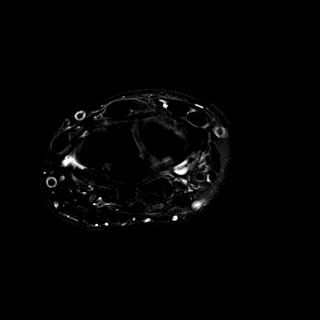
[im 14/25]
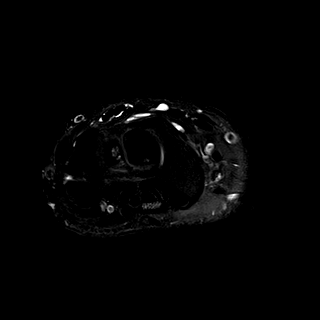
[im 18/25]
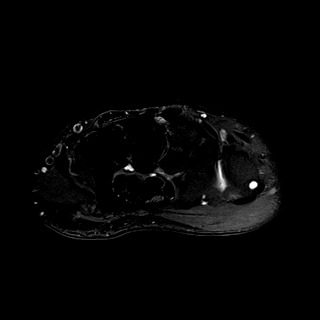
[im 21/25]
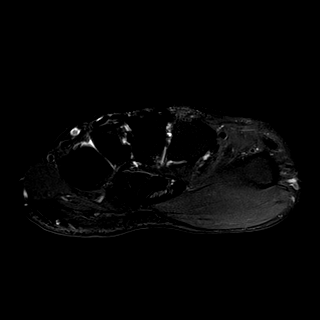
[im 25/25]
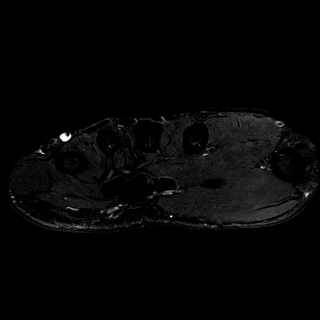

[Series 6: T1 · axial · left · 3.0mm · 0.34mm/px · z∈[-43,+34]mm · 9 of 25 slices shown (1 of 2)]
[im 1/25]
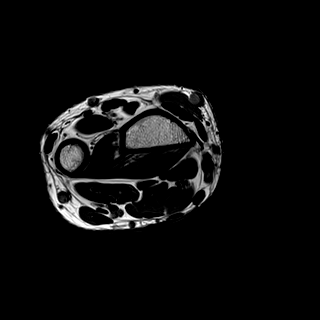
[im 4/25]
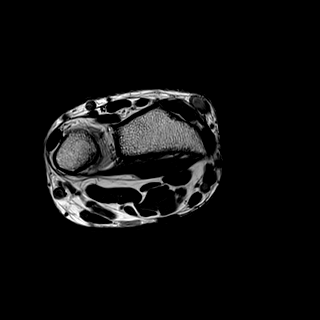
[im 7/25]
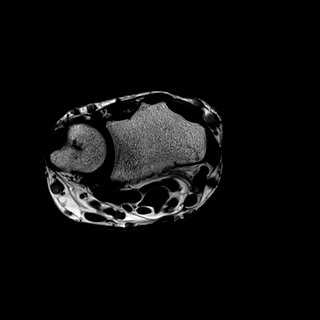
[im 10/25]
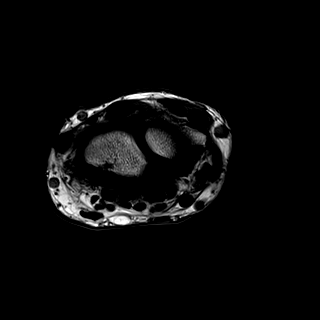
[im 13/25]
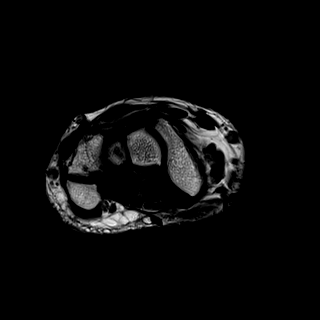
[im 16/25]
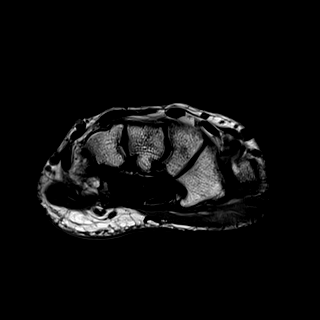
[im 19/25]
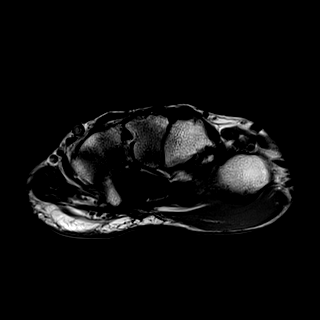
[im 22/25]
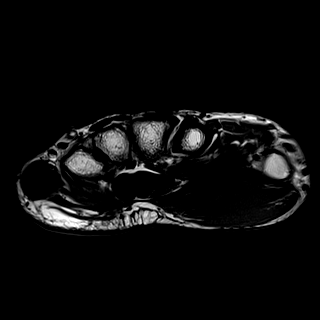
[im 25/25]
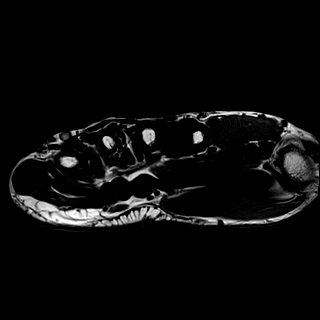

[Series 7: T1 · coronal · left · 3.0mm · 0.29mm/px · 5 of 13 slices shown (2 of 2)]
[im 1/13]
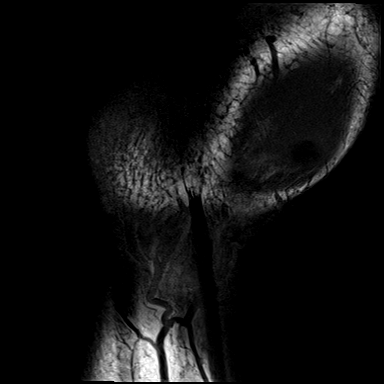
[im 4/13]
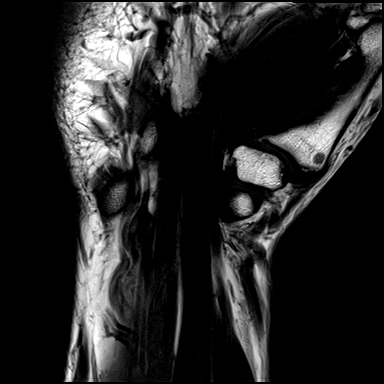
[im 7/13]
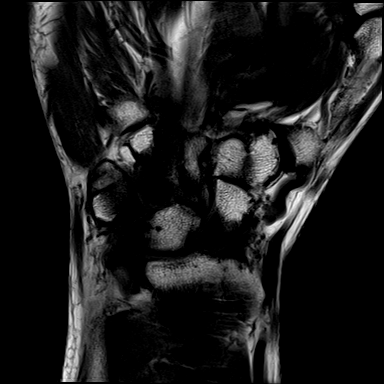
[im 10/13]
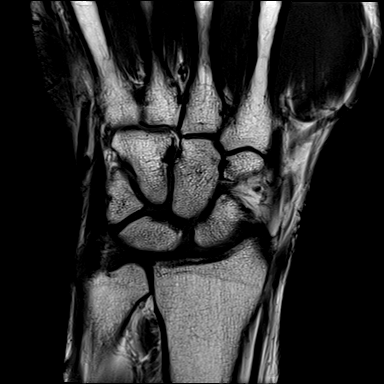
[im 13/13]
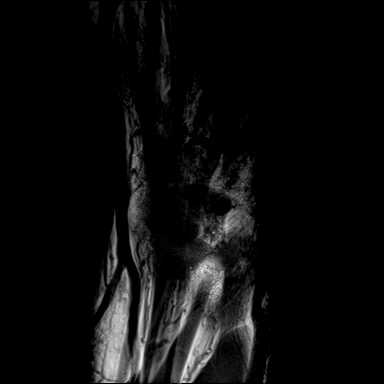

[Series 8: T2 fat-sat · coronal · left · 3.0mm · 0.34mm/px · 5 of 13 slices shown (2 of 2)]
[im 1/13]
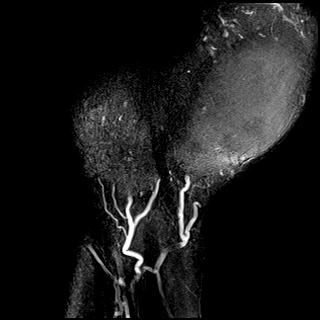
[im 4/13]
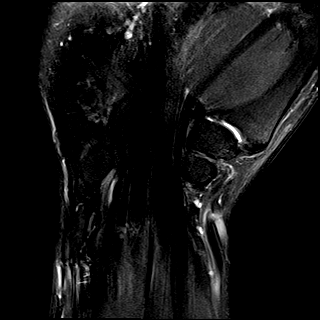
[im 7/13]
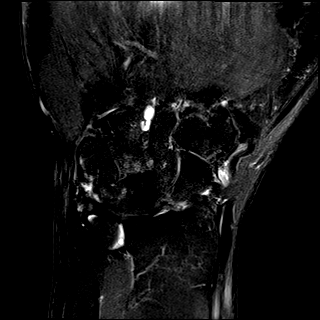
[im 10/13]
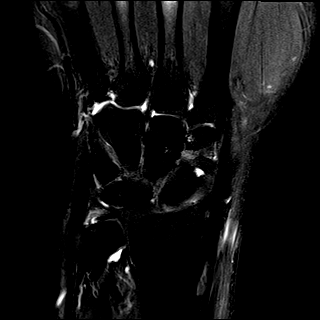
[im 13/13]
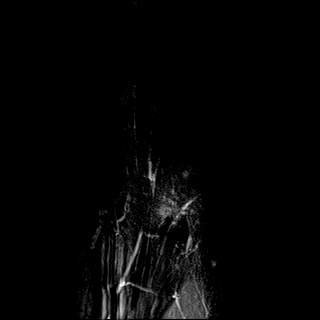

[Series 10: PD fat-sat · coronal · left · 3.0mm · 0.31mm/px · 5 of 13 slices shown (1 of 2)]
[im 1/13]
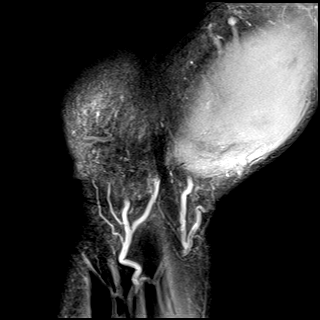
[im 4/13]
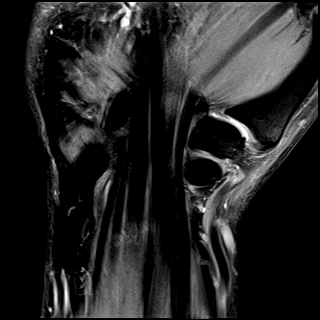
[im 7/13]
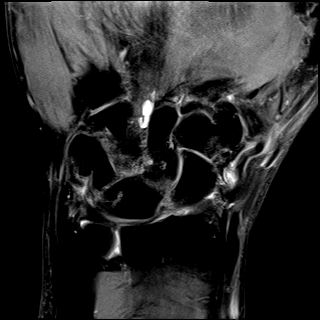
[im 10/13]
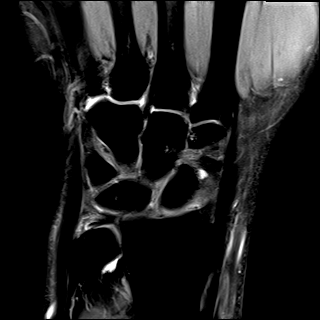
[im 13/13]
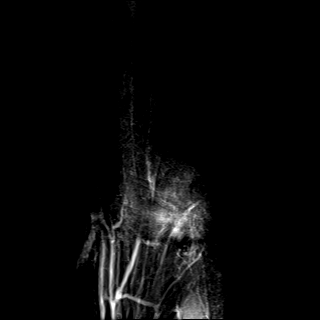

[Series 11: PD fat-sat · sagittal · left · 3.0mm · 0.39mm/px · 8 of 24 slices shown (2 of 2)]
[im 1/24]
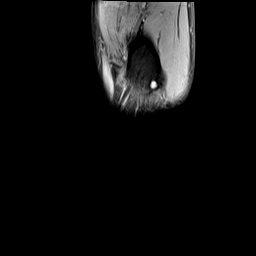
[im 4/24]
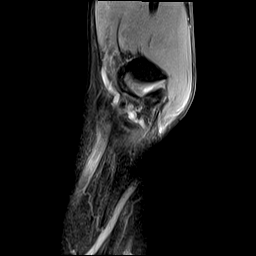
[im 7/24]
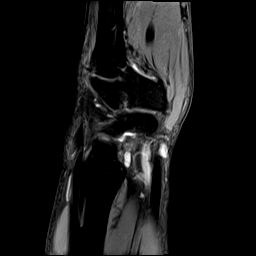
[im 10/24]
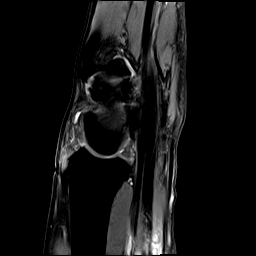
[im 14/24]
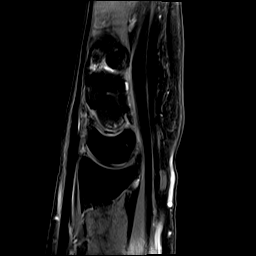
[im 17/24]
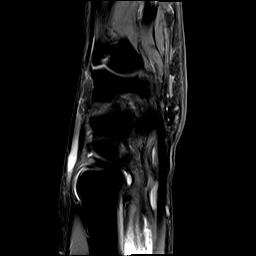
[im 20/24]
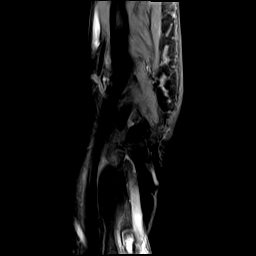
[im 24/24]
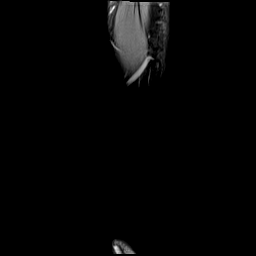

[40 of 40 positions shown; findings below may reference images not displayed]

FINDINGS: Ligaments: Extrinsic ligaments are intact. Scapholunate and
lunotriquetral ligaments are intact.

Triangular fibrocartilage: Small full-thickness perforation of the
TFCC.

Tendons: Flexor tendons are intact. Moderate tendinosis of the
extensor carpi ulnaris tendon with a short-segment longitudinal
split tear.

Carpal tunnel/median nerve: Flexor retinaculum is intact. Normal
carpal tunnel without a mass. Median nerve demonstrates normal
signal and caliber.

Guyon's canal: Normal Guyon's canal. Normal ulnar nerve.

Joint/cartilage: No joint effusion. Mild osteoarthritis of the
scaphotrapeziotrapezoid joint. Moderate osteoarthritis of the first
CMC joint.

Bones/carpal alignment: No fracture, avascular necrosis, or osseous
lesion. Normal alignment.

Other: Muscles are normal. No fluid collection, hematoma, or soft
tissue mass.
IMPRESSION: 1. Small full-thickness perforation of the TFCC.
2. Moderate tendinosis of the extensor carpi ulnaris tendon with a
short-segment longitudinal split tear.
3. Mild osteoarthritis of the scaphotrapeziotrapezoid joint.
4. Moderate osteoarthritis of the first CMC joint.

ADDENDUM:
Not mentioned above:

10 x 3 x 15 mm multiloculated cystic mass along the dorsal
intercarpal ligament at the level of the capitate-scaphoid most
consistent with a small ganglion cyst.

*** End of Addendum ***
FINDINGS: Ligaments: Extrinsic ligaments are intact. Scapholunate and
lunotriquetral ligaments are intact.

Triangular fibrocartilage: Small full-thickness perforation of the
TFCC.

Tendons: Flexor tendons are intact. Moderate tendinosis of the
extensor carpi ulnaris tendon with a short-segment longitudinal
split tear.

Carpal tunnel/median nerve: Flexor retinaculum is intact. Normal
carpal tunnel without a mass. Median nerve demonstrates normal
signal and caliber.

Guyon's canal: Normal Guyon's canal. Normal ulnar nerve.

Joint/cartilage: No joint effusion. Mild osteoarthritis of the
scaphotrapeziotrapezoid joint. Moderate osteoarthritis of the first
CMC joint.

Bones/carpal alignment: No fracture, avascular necrosis, or osseous
lesion. Normal alignment.

Other: Muscles are normal. No fluid collection, hematoma, or soft
tissue mass.
IMPRESSION: 1. Small full-thickness perforation of the TFCC.
2. Moderate tendinosis of the extensor carpi ulnaris tendon with a
short-segment longitudinal split tear.
3. Mild osteoarthritis of the scaphotrapeziotrapezoid joint.
4. Moderate osteoarthritis of the first CMC joint.

## 2022-09-07 ENCOUNTER — Ambulatory Visit (HOSPITAL_COMMUNITY): Admit: 2022-09-07 | Payer: Self-pay

## 2022-10-03 DIAGNOSIS — E1165 Type 2 diabetes mellitus with hyperglycemia: Secondary | ICD-10-CM | POA: Diagnosis not present

## 2022-10-03 DIAGNOSIS — J4540 Moderate persistent asthma, uncomplicated: Secondary | ICD-10-CM | POA: Diagnosis not present

## 2022-10-03 DIAGNOSIS — M159 Polyosteoarthritis, unspecified: Secondary | ICD-10-CM | POA: Diagnosis not present

## 2022-10-03 DIAGNOSIS — Z0000 Encounter for general adult medical examination without abnormal findings: Secondary | ICD-10-CM | POA: Diagnosis not present

## 2022-10-03 DIAGNOSIS — M5126 Other intervertebral disc displacement, lumbar region: Secondary | ICD-10-CM | POA: Diagnosis not present

## 2022-10-03 DIAGNOSIS — E1140 Type 2 diabetes mellitus with diabetic neuropathy, unspecified: Secondary | ICD-10-CM | POA: Diagnosis not present

## 2022-10-03 DIAGNOSIS — M1711 Unilateral primary osteoarthritis, right knee: Secondary | ICD-10-CM | POA: Diagnosis not present

## 2022-10-03 DIAGNOSIS — G4733 Obstructive sleep apnea (adult) (pediatric): Secondary | ICD-10-CM | POA: Diagnosis not present

## 2022-10-03 DIAGNOSIS — E782 Mixed hyperlipidemia: Secondary | ICD-10-CM | POA: Diagnosis not present

## 2022-10-17 ENCOUNTER — Encounter: Payer: Self-pay | Admitting: Gastroenterology

## 2022-11-20 ENCOUNTER — Ambulatory Visit: Payer: 59 | Admitting: Gastroenterology

## 2023-03-05 ENCOUNTER — Other Ambulatory Visit (HOSPITAL_COMMUNITY): Payer: Self-pay

## 2023-04-11 DIAGNOSIS — E1165 Type 2 diabetes mellitus with hyperglycemia: Secondary | ICD-10-CM | POA: Diagnosis not present

## 2023-04-11 DIAGNOSIS — I10 Essential (primary) hypertension: Secondary | ICD-10-CM | POA: Diagnosis not present

## 2023-04-11 DIAGNOSIS — M25561 Pain in right knee: Secondary | ICD-10-CM | POA: Diagnosis not present

## 2023-04-17 DIAGNOSIS — I10 Essential (primary) hypertension: Secondary | ICD-10-CM | POA: Diagnosis not present

## 2023-04-17 DIAGNOSIS — F1090 Alcohol use, unspecified, uncomplicated: Secondary | ICD-10-CM | POA: Diagnosis not present

## 2023-04-17 DIAGNOSIS — R413 Other amnesia: Secondary | ICD-10-CM | POA: Diagnosis not present

## 2023-04-17 DIAGNOSIS — E1165 Type 2 diabetes mellitus with hyperglycemia: Secondary | ICD-10-CM | POA: Diagnosis not present

## 2023-04-17 DIAGNOSIS — R1010 Upper abdominal pain, unspecified: Secondary | ICD-10-CM | POA: Diagnosis not present

## 2023-04-17 DIAGNOSIS — F5101 Primary insomnia: Secondary | ICD-10-CM | POA: Diagnosis not present

## 2023-04-17 DIAGNOSIS — E782 Mixed hyperlipidemia: Secondary | ICD-10-CM | POA: Diagnosis not present

## 2023-04-26 ENCOUNTER — Other Ambulatory Visit: Payer: Self-pay

## 2023-04-30 DIAGNOSIS — R413 Other amnesia: Secondary | ICD-10-CM | POA: Diagnosis not present

## 2023-04-30 DIAGNOSIS — F5101 Primary insomnia: Secondary | ICD-10-CM | POA: Diagnosis not present

## 2023-04-30 DIAGNOSIS — R1010 Upper abdominal pain, unspecified: Secondary | ICD-10-CM | POA: Diagnosis not present

## 2023-04-30 DIAGNOSIS — E1165 Type 2 diabetes mellitus with hyperglycemia: Secondary | ICD-10-CM | POA: Diagnosis not present

## 2023-04-30 DIAGNOSIS — E782 Mixed hyperlipidemia: Secondary | ICD-10-CM | POA: Diagnosis not present

## 2023-04-30 DIAGNOSIS — I10 Essential (primary) hypertension: Secondary | ICD-10-CM | POA: Diagnosis not present

## 2023-04-30 DIAGNOSIS — F1090 Alcohol use, unspecified, uncomplicated: Secondary | ICD-10-CM | POA: Diagnosis not present

## 2023-04-30 DIAGNOSIS — R269 Unspecified abnormalities of gait and mobility: Secondary | ICD-10-CM | POA: Diagnosis not present

## 2023-05-03 DIAGNOSIS — Z9889 Other specified postprocedural states: Secondary | ICD-10-CM | POA: Diagnosis not present

## 2023-05-08 DIAGNOSIS — R066 Hiccough: Secondary | ICD-10-CM | POA: Diagnosis not present

## 2023-05-10 ENCOUNTER — Other Ambulatory Visit (HOSPITAL_COMMUNITY): Payer: Self-pay | Admitting: Internal Medicine

## 2023-05-10 ENCOUNTER — Encounter: Payer: Self-pay | Admitting: Physician Assistant

## 2023-05-10 DIAGNOSIS — K649 Unspecified hemorrhoids: Secondary | ICD-10-CM | POA: Diagnosis not present

## 2023-05-10 DIAGNOSIS — R1110 Vomiting, unspecified: Secondary | ICD-10-CM | POA: Diagnosis not present

## 2023-05-10 DIAGNOSIS — R066 Hiccough: Secondary | ICD-10-CM

## 2023-05-10 DIAGNOSIS — R1013 Epigastric pain: Secondary | ICD-10-CM | POA: Diagnosis not present

## 2023-05-11 ENCOUNTER — Encounter (HOSPITAL_COMMUNITY): Payer: Self-pay

## 2023-05-11 ENCOUNTER — Ambulatory Visit (HOSPITAL_COMMUNITY): Payer: 59 | Attending: Internal Medicine

## 2023-06-20 DIAGNOSIS — I10 Essential (primary) hypertension: Secondary | ICD-10-CM | POA: Diagnosis not present

## 2023-06-20 DIAGNOSIS — E519 Thiamine deficiency, unspecified: Secondary | ICD-10-CM | POA: Diagnosis not present

## 2023-06-20 DIAGNOSIS — B354 Tinea corporis: Secondary | ICD-10-CM | POA: Diagnosis not present

## 2023-06-20 DIAGNOSIS — E1165 Type 2 diabetes mellitus with hyperglycemia: Secondary | ICD-10-CM | POA: Diagnosis not present

## 2023-07-19 ENCOUNTER — Telehealth: Payer: Self-pay | Admitting: Neurology

## 2023-07-19 NOTE — Telephone Encounter (Signed)
Mychart

## 2023-07-23 ENCOUNTER — Other Ambulatory Visit: Payer: Self-pay

## 2023-07-23 ENCOUNTER — Encounter: Payer: Self-pay | Admitting: Neurology

## 2023-07-23 ENCOUNTER — Ambulatory Visit: Payer: 59 | Admitting: Neurology

## 2023-07-23 VITALS — BP 144/88 | HR 99 | Resp 15 | Ht 70.0 in

## 2023-07-23 DIAGNOSIS — R413 Other amnesia: Secondary | ICD-10-CM

## 2023-07-23 DIAGNOSIS — R799 Abnormal finding of blood chemistry, unspecified: Secondary | ICD-10-CM | POA: Diagnosis not present

## 2023-07-23 NOTE — Progress Notes (Signed)
Chief Complaint  Patient presents with   New Patient (Initial Visit)    Rm14, alone, referral for Memory loss, gait disturbance, hx of CVA/Antonio Lee, Antonio Lee at [place]: moca was 23   ASSESSMENT AND PLAN  Antonio Lee is a 62 y.o. male   Mild cognitive impairment Reported history of stroke in the past, I could not find outside record  Complete evaluation with MRI of the brain, with without contrast  EEG  Laboratory evaluations  DIAGNOSTIC DATA (LABS, IMAGING, TESTING) - I reviewed patient records, labs, notes, testing and imaging myself where available.   MEDICAL HISTORY:  Antonio Lee, is a 62 year old male seen in request by his primary care from [facility] [place], [place] for evaluation of mild cognitive impairment, history of of stroke in the past, initial evaluation was on July 23, 2023,    History is obtained from the patient and review of electronic medical records. I personally reviewed pertinent available imaging films in PACS.   PMHx of  DM since age 33s. HLD HTN Stop smoking in 2013 Sleep apnea, CPAP,  CAD Chronic low back pain,  Paranoid schizophrenic  Patient reported a history of stroke, happened in [place], in 2021, has been on disability since, he used to work Aeronautical engineer for highway, 1 day he went to work, but did not feel right, later his coworker found him outside, was sent to the hospital, was told he had a stroke  He also suffered severe COVID in 2021, required prolonged ICU stay, CT chest in October 2022 demonstrated bilateral pulmonary parenchymal scarring, right greater than left,  From [place] to live with her daughter in [place] since then, he still drives short distance, noticed significant memory loss, slowed thinking, he had long history of paranoid schizophrenic, previously was treated with medications, but has stopped the medication use many years ago  Since his stroke, he often felt lonely, have crying  spells, but denies hallucinations, his mind tends to stack on things, could not turn off, difficulty falling to sleep, his mother suffered memory loss  Labs in July 2024, LDL 112, A1C 6.4,  CMP,    PHYSICAL EXAM:   Vitals:   07/23/23 1442  BP: (!) 144/88  Pulse: 99  Resp: 15  Height: 5\' 10"  (1.778 m)     Body mass index is 28.41 kg/m.  PHYSICAL EXAMNIATION:  Gen: NAD, conversant, well nourised, well groomed                     Cardiovascular: Regular rate rhythm, no peripheral edema, warm, nontender. Eyes: Conjunctivae clear without exudates or hemorrhage Neck: Supple, no carotid bruits. Pulmonary: Clear to auscultation bilaterally   NEUROLOGICAL EXAM:  MENTAL STATUS: Speech/cognition: Awake, alert, oriented to history taking and casual conversation    07/23/2023    2:45 PM  Montreal Cognitive Assessment   Visuospatial/ Executive (0/5) 2  Naming (0/3) 3  Attention: Read list of digits (0/2) 1  Attention: Read list of letters (0/1) 1  Attention: Serial 7 subtraction starting at 100 (0/3) 3  Language: Repeat phrase (0/2) 1  Language : Fluency (0/1) 1  Abstraction (0/2) 2  Delayed Recall (0/5) 4  Orientation (0/6) 6  Total 24    CRANIAL NERVES: CN II: Visual fields are full to confrontation. Pupils are round equal and briskly reactive to light. CN III, IV, VI: extraocular movement are normal. No ptosis. CN V: Facial sensation is intact to light touch CN VII: Face is symmetric  with normal eye closure  CN VIII: Hearing is normal to causal conversation. CN IX, X: Phonation is normal. CN XI: Head turning and shoulder shrug are intact  MOTOR: There is no pronator drift of out-stretched arms. Muscle bulk and tone are normal. Muscle strength is normal.  REFLEXES: Reflexes are 2+ and symmetric at the biceps, triceps, knees, and ankles. Plantar responses are flexor.  SENSORY: Intact to light touch, pinprick and vibratory sensation are intact in fingers and  toes.  COORDINATION: There is no trunk or limb dysmetria noted.  GAIT/STANCE: Pushing up from seated position, dragging right leg some, cautious  REVIEW OF SYSTEMS:  Full 14 system review of systems performed and notable only for as above All other review of systems were negative.   ALLERGIES: No Known Allergies  HOME MEDICATIONS: Current Outpatient Medications  Medication Sig Dispense Refill   atorvastatin (LIPITOR) 40 MG tablet Take 1 tablet by mouth once daily. 30 tablet 3   BINAXNOW COVID-19 AG HOME TEST KIT      diclofenac Sodium (VOLTAREN) 1 % GEL Apply 1 application. topically 4 (four) times daily as needed for pain.     dicyclomine (BENTYL) 20 MG tablet Take 1 tablet (20 mg total) by mouth 2 (two) times daily. 20 tablet 0   empagliflozin (JARDIANCE) 10 MG TABS tablet TAKE 1 TABLET BY MOUTH ONCE DAILY FOR 90 DAYS     ezetimibe (ZETIA) 10 MG tablet Take 1 tablet (10 mg total) by mouth daily. 90 tablet 3   fluticasone-salmeterol (ADVAIR DISKUS) 250-50 MCG/ACT AEPB Inhale 1 puff into the lungs in the morning and at bedtime. 60 each 4   gabapentin (NEURONTIN) 100 MG capsule Take 100 mg by mouth daily.     HYDROcodone-acetaminophen (NORCO/VICODIN) 5-325 MG tablet Take 1 tablet by mouth every 6 (six) hours as needed for moderate pain or severe pain. 4 tablet 0   lisinopril (ZESTRIL) 20 MG tablet Take 20 mg by mouth daily.     naproxen sodium (ALEVE) 220 MG tablet Take 220 mg by mouth daily as needed (knee pain).     ondansetron (ZOFRAN-ODT) 4 MG disintegrating tablet 4mg  ODT q4 hours prn nausea/vomit 10 tablet 0   ONETOUCH VERIO test strip daily.     pantoprazole (PROTONIX) 40 MG tablet Take 1 tablet (40 mg total) by mouth daily. 30 tablet 3   PROAIR HFA 108 (90 Base) MCG/ACT inhaler Inhale 2 puffs into the lungs every 4 (four) hours as needed for wheezing or shortness of breath. 18 g 6   sildenafil (VIAGRA) 50 MG tablet Take 50 mg by mouth daily as needed for erectile dysfunction.      sucralfate (CARAFATE) 1 GM/10ML suspension Take 10 mLs (1 g total) by mouth 4 (four) times daily -  with meals and at bedtime. 420 mL 0   tiZANidine (ZANAFLEX) 4 MG tablet Take 4 mg by mouth 3 (three) times daily.     No current facility-administered medications for this visit.    PAST MEDICAL HISTORY: Past Medical History:  Diagnosis Date   COVID-19    CVA (cerebral vascular accident) (HCC)    Diabetes mellitus, type II (HCC)    GERD (gastroesophageal reflux disease)    MI (myocardial infarction) (HCC)    Osteoarthritis    Sleep apnea     PAST SURGICAL HISTORY: Past Surgical History:  Procedure Laterality Date   KNEE SURGERY Right     FAMILY HISTORY: Family History  Problem Relation Age of Onset   Hypertension  Mother    Lupus Mother    Heart attack Father 106   Hyperlipidemia Father    Hypertension Father    Diabetes Father    Heart attack Paternal Aunt        CABG   Heart disease Paternal Aunt    Stroke Paternal Grandfather     SOCIAL HISTORY: Social History   Socioeconomic History   Marital status: Divorced    Spouse name: Not on file   Number of children: Not on file   Years of education: Not on file   Highest education level: Not on file  Occupational History   Not on file  Tobacco Use   Smoking status: Former    Current packs/day: 0.00    Average packs/day: 1 pack/day for 30.0 years (30.0 ttl pk-yrs)    Types: Cigarettes    Start date: 32    Quit date: 2012    Years since quitting: 12.8   Smokeless tobacco: Not on file  Vaping Use   Vaping status: Never Used  Substance and Sexual Activity   Alcohol use: Yes    Comment: social drinker   Drug use: Not Currently   Sexual activity: Not on file  Other Topics Concern   Not on file  Social History Narrative   Not on file   Social Determinants of Health   Financial Resource Strain: Not on file  Food Insecurity: Not on file  Transportation Needs: Not on file  Physical Activity: Not on file   Stress: Not on file  Social Connections: Not on file  Intimate Partner Violence: Not on file      Levert Feinstein, M.D. Ph.D.  [facility] [address] [place], [place] [address] Ph: [phone] Fax: [phone]  CC:  Collene Mares, [place] [address] [place],  [place] [address]  [place], [place], [place]

## 2023-07-24 ENCOUNTER — Ambulatory Visit: Payer: 59 | Admitting: Physician Assistant

## 2023-07-24 LAB — ANA W/REFLEX IF POSITIVE
Anti JO-1: <0.2 AI (ref 0.0–0.9)
Anti Nuclear Antibody (ANA): POSITIVE — AB
Centromere Ab Screen: <0.2 AI (ref 0.0–0.9)
Chromatin Ab SerPl-aCnc: <0.2 AI (ref 0.0–0.9)
ENA RNP Ab: 0.2 AI (ref 0.0–0.9)
ENA SM Ab Ser-aCnc: <0.2 AI (ref 0.0–0.9)
ENA SSA (RO) Ab: 2.2 AI — ABNORMAL HIGH (ref 0.0–0.9)
ENA SSB (LA) Ab: 0.3 AI (ref 0.0–0.9)
Scleroderma (Scl-70) (ENA) Antibody, IgG: <0.2 AI (ref 0.0–0.9)
dsDNA Ab: <1 [IU]/mL (ref 0–9)

## 2023-07-24 LAB — FOLATE: Folate: 5 ng/mL (ref >3.0)

## 2023-07-24 LAB — RPR: RPR Ser Ql: NONREACTIVE

## 2023-07-24 LAB — TSH: TSH: 1.83 u[IU]/mL (ref 0.450–4.500)

## 2023-07-24 LAB — SEDIMENTATION RATE: Sed Rate: 36 mm/h — ABNORMAL HIGH (ref 0–30)

## 2023-07-24 LAB — VITAMIN B12: Vitamin B-12: 663 pg/mL (ref 232–1245)

## 2023-07-24 LAB — C-REACTIVE PROTEIN: CRP: 9 mg/L (ref 0–10)

## 2023-07-24 LAB — HIV ANTIBODY (ROUTINE TESTING W REFLEX): HIV Screen 4th Generation wRfx: NONREACTIVE

## 2023-07-24 NOTE — Progress Notes (Deleted)
07/24/2023 Antonio Lee [id number] [birth year]  Referring provider: Collene Mares, PA Primary GI doctor: {acdocs:[id number]}  ASSESSMENT AND PLAN:   Assessment and Plan              Patient Care Team: Collene Mares, [place] as PCP - General (Internal Medicine)  HISTORY OF PRESENT ILLNESS: 62 y.o. male with a past medical history of diabetes, GERD CVA, syncope, OSA and others listed below presents for evaluation of ***.   Previously seen by [facility] 01/22/2013 ER visit for abdominal pain with vomiting CT abdomen pelvis with contrast showed no acute abdominal pelvic findings, diffuse fairly marked fatty infiltration of the liver unremarkable spleen and pancreas no biliary duct dilation normal esophagus. 02/28/2022 kidney function unremarkable AST 46, ALT 41, alk phos 78, total bilirubin 0.6, normal INR, MCV 75.7, Hgb 14.1, platelets 280281, WBC 6.2 07/23/2023 sed rate 36, normal CRP normal thyroid negative HIV B12 unremarkable  Discussed the use of AI scribe software for clinical note transcription with the patient, who gave verbal consent to proceed.  History of Present Illness             He {Actions; denies-reports:120008} blood thinner use.  He {Actions; denies-reports:120008} NSAID use.  He {Actions; denies-reports:120008} ETOH use.   He {Actions; denies-reports:120008} tobacco use.  He {Actions; denies-reports:120008} drug use.    He  reports that he quit smoking about 12 years ago. His smoking use included cigarettes. He started smoking about 42 years ago. He has a 30 pack-year smoking history. He does not have any smokeless tobacco history on file. He reports current alcohol use. He reports that he does not currently use drugs.  RELEVANT LABS AND IMAGING:  Results          CBC    Component Value Date/Time   WBC 6.2 02/28/2022 2104   RBC 5.93 (H) 02/28/2022 2104   HGB 14.1 02/28/2022 2104   HCT 44.9 02/28/2022 2104   PLT 281 02/28/2022 2104   MCV  75.7 (L) 02/28/2022 2104   MCH 23.8 (L) 02/28/2022 2104   MCHC 31.4 02/28/2022 2104   RDW 14.9 02/28/2022 2104   LYMPHSABS 3.0 02/28/2022 2104   MONOABS 0.3 02/28/2022 2104   EOSABS 0.1 02/28/2022 2104   BASOSABS 0.0 02/28/2022 2104   No results for input(s): "HGB" in the last 8760 hours.  CMP     Component Value Date/Time   NA 141 02/28/2022 2104   K 3.5 02/28/2022 2104   CL 102 02/28/2022 2104   CO2 26 02/28/2022 2104   GLUCOSE 120 (H) 02/28/2022 2104   BUN 7 (L) 02/28/2022 2104   CREATININE 0.77 02/28/2022 2104   CALCIUM 9.0 02/28/2022 2104   PROT 7.6 02/28/2022 2104   ALBUMIN 4.3 02/28/2022 2104   AST 46 (H) 02/28/2022 2104   ALT 41 02/28/2022 2104   ALKPHOS 78 02/28/2022 2104   BILITOT 0.6 02/28/2022 2104   GFRNONAA >60 02/28/2022 2104      Latest Ref Rng & Units 02/28/2022    9:04 PM 01/22/2022    8:58 AM  Hepatic Function  Total Protein 6.5 - 8.1 g/dL 7.6  7.2   Albumin 3.5 - 5.0 g/dL 4.3  4.1   AST 15 - 41 U/L 46  61   ALT 0 - 44 U/L 41  55   Alk Phosphatase 38 - 126 U/L 78  75   Total Bilirubin 0.3 - 1.2 mg/dL 0.6  1.1  Current Medications:   Current Outpatient Medications (Endocrine & Metabolic):    empagliflozin (JARDIANCE) 10 MG TABS tablet, TAKE 1 TABLET BY MOUTH ONCE DAILY FOR 90 DAYS  Current Outpatient Medications (Cardiovascular):    atorvastatin (LIPITOR) 40 MG tablet, Take 1 tablet by mouth once daily.   ezetimibe (ZETIA) 10 MG tablet, Take 1 tablet (10 mg total) by mouth daily.   lisinopril (ZESTRIL) 20 MG tablet, Take 20 mg by mouth daily.   sildenafil (VIAGRA) 50 MG tablet, Take 50 mg by mouth daily as needed for erectile dysfunction.  Current Outpatient Medications (Respiratory):    fluticasone-salmeterol (ADVAIR DISKUS) 250-50 MCG/ACT AEPB, Inhale 1 puff into the lungs in the morning and at bedtime.   PROAIR HFA 108 (90 Base) MCG/ACT inhaler, Inhale 2 puffs into the lungs every 4 (four) hours as needed for wheezing or shortness of  breath.  Current Outpatient Medications (Analgesics):    HYDROcodone-acetaminophen (NORCO/VICODIN) 5-325 MG tablet, Take 1 tablet by mouth every 6 (six) hours as needed for moderate pain or severe pain.   naproxen sodium (ALEVE) 220 MG tablet, Take 220 mg by mouth daily as needed (knee pain).   Current Outpatient Medications (Other):    BINAXNOW COVID-19 AG HOME TEST KIT,    diclofenac Sodium (VOLTAREN) 1 % GEL, Apply 1 application. topically 4 (four) times daily as needed for pain.   dicyclomine (BENTYL) 20 MG tablet, Take 1 tablet (20 mg total) by mouth 2 (two) times daily.   gabapentin (NEURONTIN) 100 MG capsule, Take 100 mg by mouth daily.   ondansetron (ZOFRAN-ODT) 4 MG disintegrating tablet, 4mg  ODT q4 hours prn nausea/vomit   ONETOUCH VERIO test strip, daily.   pantoprazole (PROTONIX) 40 MG tablet, Take 1 tablet (40 mg total) by mouth daily.   sucralfate (CARAFATE) 1 GM/10ML suspension, Take 10 mLs (1 g total) by mouth 4 (four) times daily -  with meals and at bedtime.   tiZANidine (ZANAFLEX) 4 MG tablet, Take 4 mg by mouth 3 (three) times daily.  Medical History:  Past Medical History:  Diagnosis Date   COVID-19    CVA (cerebral vascular accident) (HCC)    Diabetes mellitus, type II (HCC)    GERD (gastroesophageal reflux disease)    MI (myocardial infarction) (HCC)    Osteoarthritis    Sleep apnea    Allergies: No Known Allergies   Surgical History:  He  has a past surgical history that includes Knee surgery (Right). Family History:  His family history includes Diabetes in his father; Heart attack in his paternal aunt; Heart attack (age of onset: 20) in his father; Heart disease in his paternal aunt; Hyperlipidemia in his father; Hypertension in his father and mother; Lupus in his mother; Stroke in his paternal grandfather.  REVIEW OF SYSTEMS  : All other systems reviewed and negative except where noted in the History of Present Illness.  PHYSICAL EXAM: There were no  vitals taken for this visit. General Appearance: Well nourished, in no apparent distress. Head:   Normocephalic and atraumatic. Eyes:  sclerae anicteric,conjunctive pink  Respiratory: Respiratory effort normal, BS equal bilaterally without rales, rhonchi, wheezing. Cardio: RRR with no MRGs. Peripheral pulses intact.  Abdomen: Soft,  {BlankSingle:19197::"Flat","Obese","Non-distended"} ,active bowel sounds. {actendernessAB:27319} tenderness {anatomy; site abdomen:5010}. {BlankMultiple:19196::"Without guarding","With guarding","Without rebound","With rebound"}. No masses. Rectal: {acrectalexam:27461} Musculoskeletal: Full ROM, {PSY - GAIT AND STATION:22860} gait. {With/Without:304960234} edema. Skin:  Dry and intact without significant lesions or rashes Neuro: Alert and  oriented x4;  No focal deficits. Psych:  Cooperative.  Normal mood and affect.    Doree Albee, PA-C 12:28 PM

## 2023-07-26 LAB — DRUG SCREEN 10 W/CONF, SERUM
Amphetamines, IA: NEGATIVE ng/mL
Barbiturates, IA: NEGATIVE ug/mL
Benzodiazepines, IA: NEGATIVE ng/mL
Cocaine & Metabolite, IA: NEGATIVE ng/mL
Methadone, IA: NEGATIVE ng/mL
Opiates, IA: NEGATIVE ng/mL
Oxycodones, IA: NEGATIVE ng/mL
Phencyclidine, IA: NEGATIVE ng/mL
Propoxyphene, IA: NEGATIVE ng/mL
THC(Marijuana) Metabolite, IA: NEGATIVE ng/mL

## 2023-07-30 ENCOUNTER — Telehealth: Payer: Self-pay | Admitting: Neurology

## 2023-07-30 NOTE — Telephone Encounter (Signed)
[employer] medicare NPR sent to GI [phone] 

## 2023-08-02 NOTE — Telephone Encounter (Signed)
GI said they called the patient and the number on file was incorrect and they also called the emergency contact and they said they do not know this patient.

## 2023-08-21 ENCOUNTER — Telehealth: Payer: Self-pay

## 2023-08-21 DIAGNOSIS — Z1212 Encounter for screening for malignant neoplasm of rectum: Secondary | ICD-10-CM | POA: Diagnosis not present

## 2023-08-21 DIAGNOSIS — Z1211 Encounter for screening for malignant neoplasm of colon: Secondary | ICD-10-CM | POA: Diagnosis not present

## 2023-08-21 NOTE — Telephone Encounter (Signed)
Patient left a voicemail today asking for a call to discuss his appointment.  It is unclear if he is talking about a past or future appointment.

## 2023-08-21 NOTE — Telephone Encounter (Signed)
 Lmtrc 1st attempt

## 2023-08-22 DIAGNOSIS — E1165 Type 2 diabetes mellitus with hyperglycemia: Secondary | ICD-10-CM | POA: Diagnosis not present

## 2023-08-22 NOTE — Telephone Encounter (Signed)
lmtrc

## 2023-08-23 NOTE — Telephone Encounter (Signed)
Lmtrc 3rd and final attempt closing encounter

## 2023-08-27 ENCOUNTER — Ambulatory Visit (INDEPENDENT_AMBULATORY_CARE_PROVIDER_SITE_OTHER): Payer: 59 | Admitting: Neurology

## 2023-08-27 DIAGNOSIS — R413 Other amnesia: Secondary | ICD-10-CM

## 2023-08-27 DIAGNOSIS — R4182 Altered mental status, unspecified: Secondary | ICD-10-CM

## 2023-08-27 DIAGNOSIS — M2550 Pain in unspecified joint: Secondary | ICD-10-CM | POA: Diagnosis not present

## 2023-08-27 DIAGNOSIS — R0609 Other forms of dyspnea: Secondary | ICD-10-CM | POA: Diagnosis not present

## 2023-08-30 DIAGNOSIS — K219 Gastro-esophageal reflux disease without esophagitis: Secondary | ICD-10-CM | POA: Diagnosis not present

## 2023-08-30 DIAGNOSIS — Z860101 Personal history of adenomatous and serrated colon polyps: Secondary | ICD-10-CM | POA: Diagnosis not present

## 2023-08-30 DIAGNOSIS — R1310 Dysphagia, unspecified: Secondary | ICD-10-CM | POA: Diagnosis not present

## 2023-09-04 DIAGNOSIS — I10 Essential (primary) hypertension: Secondary | ICD-10-CM | POA: Diagnosis not present

## 2023-09-11 DIAGNOSIS — E119 Type 2 diabetes mellitus without complications: Secondary | ICD-10-CM | POA: Diagnosis not present

## 2023-09-26 DIAGNOSIS — M5416 Radiculopathy, lumbar region: Secondary | ICD-10-CM | POA: Diagnosis not present

## 2023-09-26 DIAGNOSIS — M0579 Rheumatoid arthritis with rheumatoid factor of multiple sites without organ or systems involvement: Secondary | ICD-10-CM | POA: Diagnosis not present

## 2023-10-15 DIAGNOSIS — D125 Benign neoplasm of sigmoid colon: Secondary | ICD-10-CM | POA: Diagnosis not present

## 2023-10-15 DIAGNOSIS — K219 Gastro-esophageal reflux disease without esophagitis: Secondary | ICD-10-CM | POA: Diagnosis not present

## 2023-10-15 DIAGNOSIS — Z09 Encounter for follow-up examination after completed treatment for conditions other than malignant neoplasm: Secondary | ICD-10-CM | POA: Diagnosis not present

## 2023-10-15 DIAGNOSIS — K2940 Chronic atrophic gastritis without bleeding: Secondary | ICD-10-CM | POA: Diagnosis not present

## 2023-10-15 DIAGNOSIS — Z860101 Personal history of adenomatous and serrated colon polyps: Secondary | ICD-10-CM | POA: Diagnosis not present

## 2023-10-15 DIAGNOSIS — K31A19 Gastric intestinal metaplasia without dysplasia, unspecified site: Secondary | ICD-10-CM | POA: Diagnosis not present

## 2023-10-15 DIAGNOSIS — D124 Benign neoplasm of descending colon: Secondary | ICD-10-CM | POA: Diagnosis not present

## 2023-10-15 DIAGNOSIS — K2930 Chronic superficial gastritis without bleeding: Secondary | ICD-10-CM | POA: Diagnosis not present

## 2023-10-15 DIAGNOSIS — D123 Benign neoplasm of transverse colon: Secondary | ICD-10-CM | POA: Diagnosis not present

## 2023-10-15 DIAGNOSIS — R1310 Dysphagia, unspecified: Secondary | ICD-10-CM | POA: Diagnosis not present

## 2023-10-15 DIAGNOSIS — B9681 Helicobacter pylori [H. pylori] as the cause of diseases classified elsewhere: Secondary | ICD-10-CM | POA: Diagnosis not present

## 2023-10-15 DIAGNOSIS — D122 Benign neoplasm of ascending colon: Secondary | ICD-10-CM | POA: Diagnosis not present

## 2023-10-15 DIAGNOSIS — K209 Esophagitis, unspecified without bleeding: Secondary | ICD-10-CM | POA: Diagnosis not present

## 2023-10-22 DIAGNOSIS — K2940 Chronic atrophic gastritis without bleeding: Secondary | ICD-10-CM | POA: Diagnosis not present

## 2023-10-22 DIAGNOSIS — B9681 Helicobacter pylori [H. pylori] as the cause of diseases classified elsewhere: Secondary | ICD-10-CM | POA: Diagnosis not present

## 2023-10-22 DIAGNOSIS — D125 Benign neoplasm of sigmoid colon: Secondary | ICD-10-CM | POA: Diagnosis not present

## 2023-10-22 DIAGNOSIS — D124 Benign neoplasm of descending colon: Secondary | ICD-10-CM | POA: Diagnosis not present

## 2023-10-22 DIAGNOSIS — K219 Gastro-esophageal reflux disease without esophagitis: Secondary | ICD-10-CM | POA: Diagnosis not present

## 2023-10-25 DIAGNOSIS — M159 Polyosteoarthritis, unspecified: Secondary | ICD-10-CM | POA: Diagnosis not present

## 2023-10-25 DIAGNOSIS — J4540 Moderate persistent asthma, uncomplicated: Secondary | ICD-10-CM | POA: Diagnosis not present

## 2023-10-25 DIAGNOSIS — G629 Polyneuropathy, unspecified: Secondary | ICD-10-CM | POA: Diagnosis not present

## 2023-10-25 DIAGNOSIS — Z8619 Personal history of other infectious and parasitic diseases: Secondary | ICD-10-CM | POA: Diagnosis not present

## 2023-10-25 DIAGNOSIS — G8929 Other chronic pain: Secondary | ICD-10-CM | POA: Diagnosis not present

## 2023-10-25 DIAGNOSIS — Z0000 Encounter for general adult medical examination without abnormal findings: Secondary | ICD-10-CM | POA: Diagnosis not present

## 2023-10-25 DIAGNOSIS — I252 Old myocardial infarction: Secondary | ICD-10-CM | POA: Diagnosis not present

## 2023-10-25 DIAGNOSIS — G4733 Obstructive sleep apnea (adult) (pediatric): Secondary | ICD-10-CM | POA: Diagnosis not present

## 2023-10-25 DIAGNOSIS — Z23 Encounter for immunization: Secondary | ICD-10-CM | POA: Diagnosis not present

## 2023-10-25 DIAGNOSIS — M5126 Other intervertebral disc displacement, lumbar region: Secondary | ICD-10-CM | POA: Diagnosis not present

## 2023-10-25 DIAGNOSIS — I10 Essential (primary) hypertension: Secondary | ICD-10-CM | POA: Diagnosis not present

## 2023-10-25 DIAGNOSIS — E1165 Type 2 diabetes mellitus with hyperglycemia: Secondary | ICD-10-CM | POA: Diagnosis not present

## 2023-10-25 DIAGNOSIS — E782 Mixed hyperlipidemia: Secondary | ICD-10-CM | POA: Diagnosis not present

## 2023-10-25 DIAGNOSIS — E119 Type 2 diabetes mellitus without complications: Secondary | ICD-10-CM | POA: Diagnosis not present

## 2023-10-25 DIAGNOSIS — Z8673 Personal history of transient ischemic attack (TIA), and cerebral infarction without residual deficits: Secondary | ICD-10-CM | POA: Diagnosis not present

## 2023-11-06 ENCOUNTER — Encounter: Payer: Self-pay | Admitting: *Deleted

## 2023-11-06 NOTE — Progress Notes (Unsigned)
 Cardiology Office Note    Patient Name: Antonio Lee Date of Encounter: 11/07/2023  Primary Care Provider:  Collene Mares, PA Primary Cardiologist:  None Primary Electrophysiologist: None   Past Medical History    Past Medical History:  Diagnosis Date   COVID-19    CVA (cerebral vascular accident) (HCC)    Diabetes mellitus, type II (HCC)    GERD (gastroesophageal reflux disease)    MI (myocardial infarction) (HCC)    Osteoarthritis    Sleep apnea     History of Present Illness  Antonio Lee is a 63 y.o. male with a PMH of CVA in 2019 in [place], HTN, HLD, hepatitis C, DM type II, paranoid schizophrenia, asthma, syncope, OSA who presents today for complaint of exertional dyspnea.  Antonio Lee was seen initially by Dr. Mariah Milling in 04/2021 for complaint of shortness of breath and chest pain.  He had a TTE completed that showed EF of 55 to 60% with no RWMA and grade 1 DD and no significant valve abnormalities.  He suffered severe COVID infection in 2021 requiring prolonged ICU stay he was last seen by virtual visit on 02/07/2022 for preoperative clearance prior to right knee meniscus repair.  Antonio Lee presents today for complaint of shortness of breath and chest discomfort. The patient reports that the shortness of breath started after moving back to [place] from [place] and following a hospitalization due to COVID-19. The patient also reports experiencing chest pain, which lasts for about 15 minutes and occurs both during strenuous activity and while at rest. The patient has been using inhalers for asthma but reports that he has not been effective in relieving the shortness of breath. The patient also reports weight gain despite eating only one meal a day.  He is euvolemic on exam with no signs of volume overload.  He reports increased weight gain but weights have been stable.  His blood pressure today was stable at 122/84 and heart rate was 89 bpm.  He reports compliance  with his current medications and denies any adverse reactions.   Review of Systems  Please see the history of present illness.    All other systems reviewed and are otherwise negative except as noted above.  Physical Exam    Wt Readings from Last 3 Encounters:  11/07/23 202 lb 6.4 oz (91.8 kg)  02/28/22 198 lb (89.8 kg)  07/15/21 207 lb (93.9 kg)   VS: Vitals:   11/07/23 1535  BP: 122/84  Pulse: 89  SpO2: 96%  ,Body mass index is 29.04 kg/m. GEN: Well nourished, well developed in no acute distress Neck: No JVD; No carotid bruits Pulmonary: Clear to auscultation without rales, wheezing or rhonchi  Cardiovascular: Normal rate. Regular rhythm. Normal S1. Normal S2.   Murmurs: There is no murmur.  ABDOMEN: Soft, non-tender, non-distended EXTREMITIES:  No edema; No deformity   EKG/LABS/ Recent Cardiac Studies   ECG personally reviewed by me today -sinus rhythm with left atrial enlargement and rate of 89 bpm with no significant or acute changes from previous EKG.  Risk Assessment/Calculations:          Lab Results  Component Value Date   WBC 6.2 02/28/2022   HGB 14.1 02/28/2022   HCT 44.9 02/28/2022   MCV 75.7 (L) 02/28/2022   PLT 281 02/28/2022   Lab Results  Component Value Date   CREATININE 0.77 02/28/2022   BUN 7 (L) 02/28/2022   NA 141 02/28/2022   K 3.5 02/28/2022   CL  102 02/28/2022   CO2 26 02/28/2022   No results found for: "CHOL", "HDL", "LDLCALC", "LDLDIRECT", "TRIG", "CHOLHDL"  No results found for: "HGBA1C" Assessment & Plan    1.  Shortness of breath: -Recurrent since moving back to current location in November 2022. Noted to have wheezing on exam. History of asthma and prior COVID-19 infection with ICU stay. -Order echocardiogram to assess heart structure and function. -Order stress test to rule out cardiac cause of chest pain. -Increase Amlodipine to 10mg  daily for potential angina. -Start Lasix 20mg  daily for 3 days with concurrent Potassium  daily. -Refer to pulmonologist for further evaluation of respiratory symptoms.  2.  Primary HTN: -Patient's blood pressure today was controlled at 122/84 -Continue Lipitor 10 mg daily and discontinue lisinopril 20 mg per PCP  3.  HLD: -Patient's LDL cholesterol was 122 on 10/26/2023 -Continue ezetimibe 10 mg and Lipitor 40 mg  4.  DM type II: -Patient's last hemoglobin A1c was 6.6 on 08/2023 -Continue Jardiance 10 mg and current treatment plan per PCP  5.  History of CVA: -Secondary to heat that occurred in 2019 while living in [place] -Patient reports no residual symptoms and advised to continue Lipitor 40 mg daily  6. Chest Pain: -Occurring intermittently, lasting about 15 minutes, radiating towards neck. Noted to have left ventricular enlargement on EKG. -Order echocardiogram and stress test as above. -Increase Amlodipine to 10mg  daily. -Prescribe Nitroglycerin as needed for chest pain, with instructions for use and when to call 911. -Discontinue Viagra 50 mg     Disposition: Follow-up with None or APP in 3 months Informed Consent   Shared Decision Making/Informed Consent The risks [chest pain, shortness of breath, cardiac arrhythmias, dizziness, blood pressure fluctuations, myocardial infarction, stroke/transient ischemic attack, nausea, vomiting, allergic reaction, radiation exposure, metallic taste sensation and life-threatening complications (estimated to be 1 in 10,000)], benefits (risk stratification, diagnosing coronary artery disease, treatment guidance) and alternatives of a nuclear stress test were discussed in detail with Antonio Lee and he agrees to proceed.      Signed, Napoleon Form, Leodis Rains, NP 11/07/2023, 4:03 PM [facility]

## 2023-11-07 ENCOUNTER — Ambulatory Visit: Payer: 59 | Attending: Nurse Practitioner | Admitting: Nurse Practitioner

## 2023-11-07 ENCOUNTER — Encounter: Payer: Self-pay | Admitting: Nurse Practitioner

## 2023-11-07 VITALS — BP 122/84 | HR 89 | Ht 70.0 in | Wt 202.4 lb

## 2023-11-07 DIAGNOSIS — E118 Type 2 diabetes mellitus with unspecified complications: Secondary | ICD-10-CM

## 2023-11-07 DIAGNOSIS — R079 Chest pain, unspecified: Secondary | ICD-10-CM

## 2023-11-07 DIAGNOSIS — I10 Essential (primary) hypertension: Secondary | ICD-10-CM

## 2023-11-07 DIAGNOSIS — E785 Hyperlipidemia, unspecified: Secondary | ICD-10-CM | POA: Diagnosis not present

## 2023-11-07 DIAGNOSIS — Z79899 Other long term (current) drug therapy: Secondary | ICD-10-CM | POA: Diagnosis not present

## 2023-11-07 DIAGNOSIS — R0602 Shortness of breath: Secondary | ICD-10-CM | POA: Diagnosis not present

## 2023-11-07 MED ORDER — NITROGLYCERIN 0.4 MG SL SUBL: 0.4000 mg | SUBLINGUAL_TABLET | SUBLINGUAL | 3 refills | Status: DC | PRN

## 2023-11-07 MED ORDER — FUROSEMIDE 20 MG PO TABS: ORAL_TABLET | ORAL | 3 refills | Status: DC

## 2023-11-07 MED ORDER — POTASSIUM CHLORIDE CRYS ER 20 MEQ PO TBCR: 20.0000 meq | EXTENDED_RELEASE_TABLET | Freq: Every day | ORAL | 2 refills | Status: DC

## 2023-11-07 MED ORDER — AMLODIPINE BESYLATE 10 MG PO TABS: 10.0000 mg | ORAL_TABLET | Freq: Every day | ORAL | 3 refills | Status: AC

## 2023-11-07 NOTE — Patient Instructions (Addendum)
 Medication Instructions:  STOP Viagra  STOP Lisinopril   START nitroglycerin Place 1 tablet (0.4 mg total) under the tongue every 5 (five) minutes as needed for chest pain.  START Amlodipine 10 mg take one tablet by mouth daily  START Lasix 20 mg Take one tablet by mouth for 3 days and then take lasix as needed for swelling.,  START Potassium Chloride 20 mEq  Take 1 tablet (20 mEq total) by mouth daily. While using lasix,   *If you need a refill on your cardiac medications before your next appointment, please call your pharmacy*   Lab Work: BMP BNP TSH  If you have labs (blood work) drawn today and your tests are completely normal, you will receive your results only by: MyChart Message (if you have MyChart) OR A paper copy in the mail If you have any lab test that is abnormal or we need to change your treatment, we will call you to review the results.   Testing/Procedures: ECHO  Your physician has requested that you have an echocardiogram. Echocardiography is a painless test that uses sound waves to create images of your heart. It provides your doctor with information about the size and shape of your heart and how well your heart's chambers and valves are working. This procedure takes approximately one hour. There are no restrictions for this procedure. Please do NOT wear cologne, perfume, aftershave, or lotions (deodorant is allowed). Please arrive 15 minutes prior to your appointment time.  Please note: We ask at that you not bring children with you during ultrasound (echo/ vascular) testing. Due to room size and safety concerns, children are not allowed in the ultrasound rooms during exams. Our front office staff cannot provide observation of children in our lobby area while testing is being conducted. An adult accompanying a patient to their appointment will only be allowed in the ultrasound room at the discretion of the ultrasound technician under special circumstances. We  apologize for any inconvenience.   [facility]  Your physician has requested that you have a lexiscan myoview. For further information please visit [email or web address] Please follow instruction sheet, as given.    Follow-Up: At [facility], you and your health needs are our priority.  As part of our continuing mission to provide you with exceptional heart care, we have created designated Provider Care Teams.  These Care Teams include your primary Cardiologist (physician) and Advanced Practice Providers (APPs -  Physician Assistants and Nurse Practitioners) who all work together to provide you with the care you need, when you need it.   Your next appointment:   3 month(s)  Provider:   Robin Searing, NP     Then, Dr. Caro Hight will plan to see you again in 6 month(s).    Other Instructions   1st Floor: - Lobby - Registration  - Pharmacy  - Lab - Cafe  2nd Floor: - PV Lab - Diagnostic Testing (echo, CT, nuclear med)  3rd Floor: - Vacant  4th Floor: - TCTS (cardiothoracic surgery) - AFib Clinic - Structural Heart Clinic - Vascular Surgery  - Vascular Ultrasound  5th Floor: - HeartCare Cardiology (general and EP) - Clinical Pharmacy for coumadin, hypertension, lipid, weight-loss medications, and med management appointments    Valet parking services will be available as well.

## 2023-11-08 ENCOUNTER — Other Ambulatory Visit: Payer: Self-pay | Admitting: Nurse Practitioner

## 2023-11-08 LAB — BASIC METABOLIC PANEL
BUN/Creatinine Ratio: 10 (ref 10–24)
BUN: 8 mg/dL (ref 8–27)
CO2: 20 mmol/L (ref 20–29)
Calcium: 9.7 mg/dL (ref 8.6–10.2)
Chloride: 102 mmol/L (ref 96–106)
Creatinine, Ser: 0.79 mg/dL (ref 0.76–1.27)
Glucose: 115 mg/dL — ABNORMAL HIGH (ref 70–99)
Potassium: 3.8 mmol/L (ref 3.5–5.2)
Sodium: 143 mmol/L (ref 134–144)
eGFR: 99 mL/min/{1.73_m2} (ref >59)

## 2023-11-08 LAB — TSH: TSH: 1.1 u[IU]/mL (ref 0.450–4.500)

## 2023-11-08 LAB — BRAIN NATRIURETIC PEPTIDE: BNP: 12.9 pg/mL (ref 0.0–100.0)

## 2023-11-13 DIAGNOSIS — G4733 Obstructive sleep apnea (adult) (pediatric): Secondary | ICD-10-CM | POA: Diagnosis not present

## 2023-11-21 ENCOUNTER — Other Ambulatory Visit: Payer: Self-pay | Admitting: Nurse Practitioner

## 2023-11-21 DIAGNOSIS — R109 Unspecified abdominal pain: Secondary | ICD-10-CM | POA: Diagnosis not present

## 2023-11-26 ENCOUNTER — Ambulatory Visit
Admission: RE | Admit: 2023-11-26 | Discharge: 2023-11-26 | Disposition: A | Discharge status: Home / Self Care | Source: Ambulatory Visit | Attending: Nurse Practitioner | Admitting: Nurse Practitioner

## 2023-11-26 DIAGNOSIS — R140 Abdominal distension (gaseous): Secondary | ICD-10-CM | POA: Diagnosis not present

## 2023-11-26 DIAGNOSIS — R1032 Left lower quadrant pain: Secondary | ICD-10-CM | POA: Diagnosis not present

## 2023-11-26 DIAGNOSIS — R109 Unspecified abdominal pain: Secondary | ICD-10-CM

## 2023-11-26 MED ORDER — IOPAMIDOL (ISOVUE-300) INJECTION 61%
100.0000 mL | Freq: Once | INTRAVENOUS | Status: AC | PRN
  Administered 2023-11-26: 100 mL via INTRAVENOUS

## 2023-11-27 ENCOUNTER — Encounter (HOSPITAL_COMMUNITY): Payer: Self-pay

## 2023-12-05 ENCOUNTER — Ambulatory Visit (HOSPITAL_BASED_OUTPATIENT_CLINIC_OR_DEPARTMENT_OTHER): Payer: 59

## 2023-12-05 ENCOUNTER — Ambulatory Visit (HOSPITAL_COMMUNITY): Payer: 59 | Attending: Nurse Practitioner

## 2023-12-05 DIAGNOSIS — R0602 Shortness of breath: Secondary | ICD-10-CM | POA: Insufficient documentation

## 2023-12-05 LAB — MYOCARDIAL PERFUSION IMAGING
LV dias vol: 84 mL (ref 62–150)
LV sys vol: 34 mL
Nuc Stress EF: 59 %
Peak HR: 113 {beats}/min
Rest HR: 75 {beats}/min
Rest Nuclear Isotope Dose: 10.9 mCi
SDS: 0
SRS: 0
SSS: 0
ST Depression (mm): 0 mm
Stress Nuclear Isotope Dose: 32.1 mCi
TID: 0.78

## 2023-12-05 LAB — ECHOCARDIOGRAM COMPLETE
Area-P 1/2: 4.21 cm2
Height: 70 in
S' Lateral: 3.2 cm
Weight: 3232 [oz_av]

## 2023-12-05 MED ORDER — TECHNETIUM TC 99M TETROFOSMIN IV KIT
32.1000 | PACK | Freq: Once | INTRAVENOUS | Status: AC | PRN
  Administered 2023-12-05: 32.1 via INTRAVENOUS

## 2023-12-05 MED ORDER — TECHNETIUM TC 99M TETROFOSMIN IV KIT
10.9000 | PACK | Freq: Once | INTRAVENOUS | Status: AC | PRN
  Administered 2023-12-05: 10.9 via INTRAVENOUS

## 2023-12-05 MED ORDER — REGADENOSON 0.4 MG/5ML IV SOLN
0.4000 mg | Freq: Once | INTRAVENOUS | Status: AC
  Administered 2023-12-05: 0.4 mg via INTRAVENOUS

## 2023-12-07 ENCOUNTER — Other Ambulatory Visit: Payer: Self-pay | Admitting: Nurse Practitioner

## 2023-12-13 ENCOUNTER — Telehealth: Payer: Self-pay | Admitting: Nurse Practitioner

## 2023-12-13 MED ORDER — POTASSIUM CHLORIDE CRYS ER 20 MEQ PO TBCR: 20.0000 meq | EXTENDED_RELEASE_TABLET | Freq: Every day | ORAL | 3 refills | Status: AC

## 2023-12-13 NOTE — Telephone Encounter (Signed)
*  STAT* If patient is at the pharmacy, call can be transferred to refill team.   1. Which medications need to be refilled? (please list name of each medication and dose if known) potassium chloride SA (KLOR-CON M) 20 MEQ tablet   2. Which pharmacy/location (including street and city if local pharmacy) is medication to be sent to? [facility] - [address] [place] - [address]    3. Do they need a 30 day or 90 day supply? 90 pt switching to this pharmacy

## 2023-12-18 DIAGNOSIS — R109 Unspecified abdominal pain: Secondary | ICD-10-CM | POA: Diagnosis not present

## 2023-12-18 DIAGNOSIS — K649 Unspecified hemorrhoids: Secondary | ICD-10-CM | POA: Diagnosis not present

## 2023-12-18 DIAGNOSIS — K921 Melena: Secondary | ICD-10-CM | POA: Diagnosis not present

## 2023-12-18 DIAGNOSIS — D1730 Benign lipomatous neoplasm of skin and subcutaneous tissue of unspecified sites: Secondary | ICD-10-CM | POA: Diagnosis not present

## 2024-01-03 ENCOUNTER — Telehealth: Payer: Self-pay | Admitting: *Deleted

## 2024-01-03 ENCOUNTER — Ambulatory Visit: Payer: Self-pay | Admitting: General Surgery

## 2024-01-03 DIAGNOSIS — D171 Benign lipomatous neoplasm of skin and subcutaneous tissue of trunk: Secondary | ICD-10-CM | POA: Diagnosis not present

## 2024-01-03 DIAGNOSIS — K429 Umbilical hernia without obstruction or gangrene: Secondary | ICD-10-CM | POA: Diagnosis not present

## 2024-01-03 NOTE — Telephone Encounter (Signed)
 Faxed clearance back over to [facility] to make them aware clearance will be addressed at upcoming appointment 02/06/24.

## 2024-01-03 NOTE — Telephone Encounter (Signed)
 Preoperative team, patient has upcoming appointment with Renelda Carry on 02/06/2024.  Due to the time of his last office visit and amount of testing I will defer recommendations for upcoming procedure to Renelda Carry at that time.  Please add preoperative cardiac evaluation to appointment notes.  Thank you for your help.  Chet Cota. Jermany Sundell NP-C     01/03/2024, 11:13 AM [facility] [address] Office [phone] Fax [phone]

## 2024-01-03 NOTE — Telephone Encounter (Signed)
   Pre-operative Risk Assessment    Patient Name: Antonio Lee  DOB: [birth year] MRN: [id number]   Date of last office visit: 11/09/2023 Date of next office visit: 02/06/2024   Request for Surgical Clearance    Procedure:   UMBILICAL HERNIA / LIPOMA SURGERY  Date of Surgery:  Clearance TBD                                Surgeon:  Armond Bertin, MD Surgeon's Group or Practice Name:  [facility] Phone number:  [phone] Fax number:  [phone]   Type of Clearance Requested:   - Medical    Type of Anesthesia:  General    Additional requests/questions:    Berenda Breaker   01/03/2024, 10:45 AM

## 2024-01-08 NOTE — Pre-Procedure Instructions (Signed)
 Surgical Instructions   Your procedure is scheduled on Friday, May 2nd. Report to [facility] Main Entrance "A" at 11:30 A.M., then check in with the Admitting office. Any questions or running late day of surgery: call [phone]  Questions prior to your surgery date: call [phone], Monday-Friday, 8am-4pm. If you experience any cold or flu symptoms such as cough, fever, chills, shortness of breath, etc. between now and your scheduled surgery, please notify us  at the above number.     Remember:  Do not eat after midnight the night before your surgery   You may drink clear liquids until 10:30 AM the morning of your surgery.   Clear liquids allowed are: Water, Non-Citrus Juices (without pulp), Carbonated Beverages, Clear Tea (no milk, honey, etc.), Black Coffee Only (NO MILK, CREAM OR POWDERED CREAMER of any kind), and Gatorade.    Take these medicines the morning of surgery with A SIP OF WATER  amLODipine  (NORVASC )  dicyclomine  (BENTYL )  ezetimibe  (ZETIA )  fluticasone -salmeterol (ADVAIR)  gabapentin (NEURONTIN)  pantoprazole  (PROTONIX )  rosuvastatin (CRESTOR)  pregabalin (LYRICA)   May take these medicines IF NEEDED: albuterol  (VENTOLIN HFA)  nitroGLYCERIN  (NITROSTAT )- If you have to take this medication prior to surgery, please call [phone] and report this to a nurse  ondansetron  (ZOFRAN -ODT)    One week prior to surgery, STOP taking any Aspirin (unless otherwise instructed by your surgeon) Aleve, Naproxen, Ibuprofen, Motrin, Advil, Goody's, BC's, all herbal medications, fish oil, and non-prescription vitamins. This includes diclofenac Sodium (VOLTAREN) gel.   WHAT DO I DO ABOUT MY DIABETES MEDICATION?   STOP taking empagliflozin (JARDIANCE) 3 days prior to surgery. Last dose 4/28.   HOW TO MANAGE YOUR DIABETES BEFORE AND AFTER SURGERY  Why is it important to control my blood sugar before and after surgery? Improving blood sugar levels before and after surgery  helps healing and can limit problems. A way of improving blood sugar control is eating a healthy diet by:  Eating less sugar and carbohydrates  Increasing activity/exercise  Talking with your doctor about reaching your blood sugar goals High blood sugars (greater than 180 mg/dL) can raise your risk of infections and slow your recovery, so you will need to focus on controlling your diabetes during the weeks before surgery. Make sure that the doctor who takes care of your diabetes knows about your planned surgery including the date and location.  How do I manage my blood sugar before surgery? Check your blood sugar at least 4 times a day, starting 2 days before surgery, to make sure that the level is not too high or low.  Check your blood sugar the morning of your surgery when you wake up and every 2 hours until you get to the Short Stay unit.  If your blood sugar is less than 70 mg/dL, you will need to treat for low blood sugar: Do not take insulin. Treat a low blood sugar (less than 70 mg/dL) with  cup of clear juice (cranberry or apple), 4 glucose tablets, OR glucose gel. Recheck blood sugar in 15 minutes after treatment (to make sure it is greater than 70 mg/dL). If your blood sugar is not greater than 70 mg/dL on recheck, call [phone] for further instructions. Report your blood sugar to the short stay nurse when you get to Short Stay.  If you are admitted to the hospital after surgery: Your blood sugar will be checked by the staff and you will probably be given insulin after surgery (instead of oral diabetes medicines)  to make sure you have good blood sugar levels. The goal for blood sugar control after surgery is 80-180 mg/dL.                     Do NOT Smoke (Tobacco/Vaping) for 24 hours prior to your procedure.  If you use a CPAP at night, you may bring your mask/headgear for your overnight stay.   You will be asked to remove any contacts, glasses, piercing's, hearing aid's,  dentures/partials prior to surgery. Please bring cases for these items if needed.    Patients discharged the day of surgery will not be allowed to drive home, and someone needs to stay with them for 24 hours.  SURGICAL WAITING ROOM VISITATION Patients may have no more than 2 support people in the waiting area - these visitors may rotate.   Pre-op nurse will coordinate an appropriate time for 1 ADULT support person, who may not rotate, to accompany patient in pre-op.  Children under the age of 85 must have an adult with them who is not the patient and must remain in the main waiting area with an adult.  If the patient needs to stay at the hospital during part of their recovery, the visitor guidelines for inpatient rooms apply.  Please refer to the [facility] website for the visitor guidelines for any additional information.   If you received a COVID test during your pre-op visit  it is requested that you wear a mask when out in public, stay away from anyone that may not be feeling well and notify your surgeon if you develop symptoms. If you have been in contact with anyone that has tested positive in the last 10 days please notify you surgeon.      Pre-operative CHG Bathing Instructions   You can play a key role in reducing the risk of infection after surgery. Your skin needs to be as free of germs as possible. You can reduce the number of germs on your skin by washing with CHG (chlorhexidine gluconate) soap before surgery. CHG is an antiseptic soap that kills germs and continues to kill germs even after washing.   DO NOT use if you have an allergy to chlorhexidine/CHG or antibacterial soaps. If your skin becomes reddened or irritated, stop using the CHG and notify one of our RNs at [phone].              TAKE A SHOWER THE NIGHT BEFORE SURGERY AND THE DAY OF SURGERY    Please keep in mind the following:  DO NOT shave, including legs and underarms, 48 hours prior to surgery.   You may  shave your face before/day of surgery.  Place clean sheets on your bed the night before surgery Use a clean washcloth (not used since being washed) for each shower. DO NOT sleep with pet's night before surgery.  CHG Shower Instructions:  Wash your face and private area with normal soap. If you choose to wash your hair, wash first with your normal shampoo.  After you use shampoo/soap, rinse your hair and body thoroughly to remove shampoo/soap residue.  Turn the water OFF and apply half the bottle of CHG soap to a CLEAN washcloth.  Apply CHG soap ONLY FROM YOUR NECK DOWN TO YOUR TOES (washing for 3-5 minutes)  DO NOT use CHG soap on face, private areas, open wounds, or sores.  Pay special attention to the area where your surgery is being performed.  If you are having back  surgery, having someone wash your back for you may be helpful. Wait 2 minutes after CHG soap is applied, then you may rinse off the CHG soap.  Pat dry with a clean towel  Put on clean pajamas    Additional instructions for the day of surgery: DO NOT APPLY any lotions, deodorants, cologne, or perfumes.   Do not wear jewelry or makeup Do not wear nail polish, gel polish, artificial nails, or any other type of covering on natural nails (fingers and toes) Do not bring valuables to the hospital. [facility] is not responsible for valuables/personal belongings. Put on clean/comfortable clothes.  Please brush your teeth.  Ask your nurse before applying any prescription medications to the skin.

## 2024-01-09 ENCOUNTER — Encounter (HOSPITAL_COMMUNITY)
Admission: RE | Admit: 2024-01-09 | Discharge: 2024-01-09 | Disposition: A | Discharge status: Home / Self Care | Source: Ambulatory Visit | Attending: General Surgery | Admitting: General Surgery

## 2024-01-09 ENCOUNTER — Other Ambulatory Visit: Payer: Self-pay

## 2024-01-09 ENCOUNTER — Encounter (HOSPITAL_COMMUNITY): Payer: Self-pay

## 2024-01-09 VITALS — BP 181/95 | HR 80 | Temp 98.3°F | Resp 17 | Ht 70.0 in | Wt 208.9 lb

## 2024-01-09 DIAGNOSIS — K760 Fatty (change of) liver, not elsewhere classified: Secondary | ICD-10-CM | POA: Insufficient documentation

## 2024-01-09 DIAGNOSIS — R55 Syncope and collapse: Secondary | ICD-10-CM | POA: Diagnosis not present

## 2024-01-09 DIAGNOSIS — Z8673 Personal history of transient ischemic attack (TIA), and cerebral infarction without residual deficits: Secondary | ICD-10-CM | POA: Insufficient documentation

## 2024-01-09 DIAGNOSIS — Z87891 Personal history of nicotine dependence: Secondary | ICD-10-CM | POA: Insufficient documentation

## 2024-01-09 DIAGNOSIS — Z01812 Encounter for preprocedural laboratory examination: Secondary | ICD-10-CM | POA: Diagnosis not present

## 2024-01-09 DIAGNOSIS — K219 Gastro-esophageal reflux disease without esophagitis: Secondary | ICD-10-CM | POA: Insufficient documentation

## 2024-01-09 DIAGNOSIS — Z8619 Personal history of other infectious and parasitic diseases: Secondary | ICD-10-CM | POA: Insufficient documentation

## 2024-01-09 DIAGNOSIS — I10 Essential (primary) hypertension: Secondary | ICD-10-CM | POA: Insufficient documentation

## 2024-01-09 DIAGNOSIS — J45909 Unspecified asthma, uncomplicated: Secondary | ICD-10-CM | POA: Insufficient documentation

## 2024-01-09 DIAGNOSIS — Z01818 Encounter for other preprocedural examination: Secondary | ICD-10-CM | POA: Diagnosis present

## 2024-01-09 DIAGNOSIS — R053 Chronic cough: Secondary | ICD-10-CM | POA: Diagnosis not present

## 2024-01-09 DIAGNOSIS — G4733 Obstructive sleep apnea (adult) (pediatric): Secondary | ICD-10-CM | POA: Insufficient documentation

## 2024-01-09 DIAGNOSIS — F200 Paranoid schizophrenia: Secondary | ICD-10-CM | POA: Insufficient documentation

## 2024-01-09 DIAGNOSIS — E119 Type 2 diabetes mellitus without complications: Secondary | ICD-10-CM | POA: Insufficient documentation

## 2024-01-09 HISTORY — DX: Depression, unspecified: F32.A

## 2024-01-09 HISTORY — DX: Fatty (change of) liver, not elsewhere classified: K76.0

## 2024-01-09 HISTORY — DX: Personal history of urinary calculi: Z87.442

## 2024-01-09 HISTORY — DX: Unspecified asthma, uncomplicated: J45.909

## 2024-01-09 HISTORY — DX: Other ill-defined heart diseases: I51.89

## 2024-01-09 HISTORY — DX: Essential (primary) hypertension: I10

## 2024-01-09 HISTORY — DX: Bipolar disorder, unspecified: F31.9

## 2024-01-09 LAB — HEMOGLOBIN A1C
Hgb A1c MFr Bld: 6.1 % — ABNORMAL HIGH (ref 4.8–5.6)
Mean Plasma Glucose: 128.37 mg/dL

## 2024-01-09 LAB — COMPREHENSIVE METABOLIC PANEL WITH GFR
ALT: 52 U/L — ABNORMAL HIGH (ref 0–44)
AST: 46 U/L — ABNORMAL HIGH (ref 15–41)
Albumin: 3.9 g/dL (ref 3.5–5.0)
Alkaline Phosphatase: 75 U/L (ref 38–126)
Anion gap: 11 (ref 5–15)
BUN: <5 mg/dL — ABNORMAL LOW (ref 8–23)
CO2: 26 mmol/L (ref 22–32)
Calcium: 9.2 mg/dL (ref 8.9–10.3)
Chloride: 101 mmol/L (ref 98–111)
Creatinine, Ser: 0.71 mg/dL (ref 0.61–1.24)
GFR, Estimated: >60 mL/min (ref >60)
Glucose, Bld: 133 mg/dL — ABNORMAL HIGH (ref 70–99)
Potassium: 3.5 mmol/L (ref 3.5–5.1)
Sodium: 138 mmol/L (ref 135–145)
Total Bilirubin: 0.7 mg/dL (ref 0.0–1.2)
Total Protein: 7.6 g/dL (ref 6.5–8.1)

## 2024-01-09 LAB — GLUCOSE, CAPILLARY: Glucose-Capillary: 162 mg/dL — ABNORMAL HIGH (ref 70–99)

## 2024-01-09 LAB — CBC
HCT: 43.9 % (ref 39.0–52.0)
Hemoglobin: 13.6 g/dL (ref 13.0–17.0)
MCH: 23.6 pg — ABNORMAL LOW (ref 26.0–34.0)
MCHC: 31 g/dL (ref 30.0–36.0)
MCV: 76.2 fL — ABNORMAL LOW (ref 80.0–100.0)
Platelets: 291 10*3/uL (ref 150–400)
RBC: 5.76 MIL/uL (ref 4.22–5.81)
RDW: 15.1 % (ref 11.5–15.5)
WBC: 4.4 10*3/uL (ref 4.0–10.5)
nRBC: 0 % (ref 0.0–0.2)

## 2024-01-09 NOTE — Progress Notes (Signed)
 PCP - [place]  Annabell Key, PA  Cardiologist - none established (from what I can tell in note- pt unsure)- pt did see Dr. Jerelene Monday in 2022  PPM/ICD - denies   Chest x-ray - 02/28/22 EKG - 11/07/23 Stress Test - 12/05/23 ECHO - 12/05/23 Cardiac Cath - denies  Sleep Study - OSA+ CPAP - nightly, pt unsure of pressure settings  Fasting Blood Sugar - 150-200 Checks Blood Sugar 1-2 times a day  Last dose of GLP1 agonist-  n/a   ASA/Blood Thinner Instructions: n/a   ERAS Protcol - clears until 1030   COVID TEST- n/a   Anesthesia review: yes, cardiac hx. Pt has clearance appt, but not until 5/28. Pt states he doesn't know anything otherwise about possibly any clearance earlier. James notified, looks like testing is up to date.   Patient denies shortness of breath, fever, cough and chest pain at PAT appointment   All instructions explained to the patient, with a verbal understanding of the material. Patient agrees to go over the instructions while at home for a better understanding. The opportunity to ask questions was provided.

## 2024-01-09 NOTE — Progress Notes (Signed)
 PCP - [place]  Annabell Key, PA  Cardiologist - none established (from what I can tell in note- pt unsure)- pt did see Dr. Jerelene Monday in 2022  PPM/ICD - denies   Chest x-ray - 02/28/22 EKG - 11/07/23 Stress Test - 12/05/23 ECHO - 12/05/23 Cardiac Cath - denies  Sleep Study - OSA+ CPAP - nightly, pt unsure of pressure settings  Fasting Blood Sugar - 150-200 Checks Blood Sugar 1-2 times a day  Last dose of GLP1 agonist-  n/a Last dose Jardiance was 4/29. Pt knows not to take further doses before surgery   ASA/Blood Thinner Instructions: n/a   ERAS Protcol - clears until 1030   COVID TEST- n/a   Anesthesia review: yes, cardiac hx. Pt has clearance appt, but not until 5/28. Pt states he doesn't know anything otherwise about possibly any clearance earlier. James notified, looks like testing is up to date.   Patient denies shortness of breath, fever, cough and chest pain at PAT appointment   All instructions explained to the patient, with a verbal understanding of the material. Patient agrees to go over the instructions while at home for a better understanding. The opportunity to ask questions was provided.

## 2024-01-10 NOTE — Progress Notes (Signed)
 Anesthesia Chart Review:  63 yo male with pertinent hx including former smoker (30py, quit 2021), CVA 2019, HTN, HLD, hep C, GERD on PPI, NIDDM2, paranoid schizophrenia, asthma, chronic cough, syncope, OSA on CPAP.  Recently evaluated by cardiology for complaint of SOB and intermittent CP. Seen by Charles Connor, NP on 11/07/23. His amlodipine  was increased to 10mg  daily. His lisinopril had recently been dc'd by PCP. He was also prescribed nitroglycerin  for possible angina. Stress and echo were ordered. Echo 12/05/23 showed EF 50-55%, normal wall motion, normal RV function, no significant valvular abnormalities. Nuclear stress 12/05/23 showed normal perfusion, overall low risk.  Pt scheduled for a preop cardiology eval with Charles Connor, NP on 02/06/24. Given recent benign testing, I reached out to Sheldon and he advised pt able to achieve >4 METs and should be okay to proceed with surgery as planned barring acute status change.   Preop labs reviewed, AST/ALT very midly elevated 46/52, otherwise unremarkable. A1c well controlled at 6.1.  EKG 11/07/23: Normal sinus rhythm. Rate 89. Possible Left atrial enlargement  Nuclear stress 12/05/23:    The study is normal. The study is low risk.   No ST deviation was noted.   LV perfusion is normal.   Left ventricular function is normal. Nuclear stress EF: 59%. The left ventricular ejection fraction is normal (55-65%). End diastolic cavity size is normal. End systolic cavity size is normal.   Prior study not available for comparison.   Normal perfusion. LVEF 59% with normal wall motion. This is a low risk study. No prior for comparison.  TTE 12/05/23:  1. Left ventricular ejection fraction, by estimation, is 50 to 55%. Left  ventricular ejection fraction by 3D volume is 52 %. The left ventricle has  low normal function. The left ventricle has no regional wall motion  abnormalities. There is mild  concentric left ventricular hypertrophy. Left ventricular  diastolic  parameters are indeterminate. The average left ventricular global  longitudinal strain is -19.1 %. The global longitudinal strain is normal.   2. Right ventricular systolic function is normal. The right ventricular  size is normal.   3. The mitral valve is degenerative. Trivial mitral valve regurgitation.  No evidence of mitral stenosis.   4. The aortic valve is normal in structure. Aortic valve regurgitation is  not visualized. No aortic stenosis is present.   5. There is mild dilatation of the ascending aorta, measuring 38 mm.   6. The inferior vena cava is normal in size with greater than 50%  respiratory variability, suggesting right atrial pressure of 3 mmHg.     Edilia Gordon [facility] Short Stay Center/Anesthesiology Phone [phone] 01/10/2024 11:32 AM

## 2024-01-10 NOTE — Anesthesia Preprocedure Evaluation (Addendum)
 Anesthesia Evaluation  Patient identified by MRN, date of birth, ID band Patient awake    Reviewed: Allergy & Precautions, NPO status , Patient's Chart, lab work & pertinent test results  History of Anesthesia Complications Negative for: history of anesthetic complications  Airway Mallampati: II  TM Distance: >3 FB Neck ROM: Full    Dental  (+) Edentulous Upper, Missing, Dental Advisory Given   Pulmonary sleep apnea and Continuous Positive Airway Pressure Ventilation , former smoker   breath sounds clear to auscultation       Cardiovascular hypertension, Pt. on medications (-) angina  Rhythm:Regular Rate:Normal  11/2023 Stress:    The study is normal. The study is low risk.   No ST deviation was noted.   LV perfusion is normal.   Left ventricular function is normal. Nuclear stress EF: 59%. The LV EF is normal (55-65%). End diastolic cavitysize is normal. End systolic cavity size is normal.  11/2023 EF 50 to 55%.  1. LV 52 %. The left ventricle has low normal function, has no regional wall motion  abnormalities. There is mild LVH. Left ventricular diastolic  parameters are indeterminate. The global longitudinal strain is normal.   2. RVF is normal. The right ventricular size is normal.   3. The mitral valve is degenerative. Trivial mitral valve regurgitation. No evidence of mitral stenosis.   4. The aortic valve is normal in structure. Aortic valve regurgitation is not visualized. No aortic stenosis is present.      Neuro/Psych    Depression Bipolar Disorder Schizophrenia  CVA (heat stroke), No Residual Symptoms    GI/Hepatic Neg liver ROS,GERD  Poorly Controlled,,  Endo/Other  diabetes (glu 142)    Renal/GU H/o stones     Musculoskeletal  (+) Arthritis ,    Abdominal   Peds  Hematology Hb 13.6, plt 291k   Anesthesia Other Findings   Reproductive/Obstetrics                              Anesthesia Physical Anesthesia Plan  ASA: 3  Anesthesia Plan: General   Post-op Pain Management: Tylenol  PO (pre-op)*   Induction: Intravenous  PONV Risk Score and Plan: 2 and Ondansetron  and Dexamethasone   Airway Management Planned: Oral ETT  Additional Equipment: None  Intra-op Plan:   Post-operative Plan: Extubation in OR  Informed Consent: I have reviewed the patients History and Physical, chart, labs and discussed the procedure including the risks, benefits and alternatives for the proposed anesthesia with the patient or authorized representative who has indicated his/her understanding and acceptance.     Dental advisory given  Plan Discussed with: CRNA and Surgeon  Anesthesia Plan Comments: (PAT note by Rudy Costain, PA-C: 63 yo male with pertinent hx including former smoker (30py, quit 2021), CVA 2019, HTN, HLD, hep C, GERD on PPI, NIDDM2, paranoid schizophrenia, asthma, chronic cough, syncope, OSA on CPAP.  Recently evaluated by cardiology for complaint of SOB and intermittent CP. Seen by Charles Connor, NP on 11/07/23. His amlodipine  was increased to 10mg  daily. His lisinopril had recently been dc'd by PCP. He was also prescribed nitroglycerin  for possible angina. Stress and echo were ordered. Echo 12/05/23 showed EF 50-55%, normal wall motion, normal RV function, no significant valvular abnormalities. Nuclear stress 12/05/23 showed normal perfusion, overall low risk.  Pt scheduled for a preop cardiology eval with Charles Connor, NP on 02/06/24. Given recent benign testing, I reached out to [place] and he advised pt  able to achieve >4 METs and should be okay to proceed with surgery as planned barring acute status change.   Preop labs reviewed, AST/ALT very midly elevated 46/52, otherwise unremarkable. A1c well controlled at 6.1.  EKG 11/07/23: Normal sinus rhythm. Rate 89. Possible Left atrial enlargement  Nuclear stress 12/05/23:    The study is normal. The  study is low risk.   No ST deviation was noted.   LV perfusion is normal.   Left ventricular function is normal. Nuclear stress EF: 59%. The left ventricular ejection fraction is normal (55-65%). End diastolic cavity size is normal. End systolic cavity size is normal.   Prior study not available for comparison.  Normal perfusion. LVEF 59% with normal wall motion. This is a low risk study. No prior for comparison.  TTE 12/05/23: 1. Left ventricular ejection fraction, by estimation, is 50 to 55%. Left  ventricular ejection fraction by 3D volume is 52 %. The left ventricle has  low normal function. The left ventricle has no regional wall motion  abnormalities. There is mild  concentric left ventricular hypertrophy. Left ventricular diastolic  parameters are indeterminate. The average left ventricular global  longitudinal strain is -19.1 %. The global longitudinal strain is normal.  2. Right ventricular systolic function is normal. The right ventricular  size is normal.  3. The mitral valve is degenerative. Trivial mitral valve regurgitation.  No evidence of mitral stenosis.  4. The aortic valve is normal in structure. Aortic valve regurgitation is  not visualized. No aortic stenosis is present.  5. There is mild dilatation of the ascending aorta, measuring 38 mm.  6. The inferior vena cava is normal in size with greater than 50%  respiratory variability, suggesting right atrial pressure of 3 mmHg.   )        Anesthesia Quick Evaluation

## 2024-01-11 ENCOUNTER — Other Ambulatory Visit: Payer: Self-pay

## 2024-01-11 ENCOUNTER — Ambulatory Visit (HOSPITAL_COMMUNITY)
Admission: RE | Admit: 2024-01-11 | Discharge: 2024-01-11 | Disposition: A | Discharge status: Home / Self Care | Attending: General Surgery | Admitting: General Surgery

## 2024-01-11 ENCOUNTER — Ambulatory Visit (HOSPITAL_COMMUNITY): Payer: Self-pay | Admitting: Physician Assistant

## 2024-01-11 ENCOUNTER — Encounter (HOSPITAL_COMMUNITY)
Admission: RE | Disposition: A | Discharge status: Home / Self Care | Payer: Self-pay | Source: Home / Self Care | Attending: General Surgery

## 2024-01-11 ENCOUNTER — Ambulatory Visit (HOSPITAL_BASED_OUTPATIENT_CLINIC_OR_DEPARTMENT_OTHER): Admitting: Anesthesiology

## 2024-01-11 ENCOUNTER — Encounter (HOSPITAL_COMMUNITY): Payer: Self-pay | Admitting: General Surgery

## 2024-01-11 DIAGNOSIS — K654 Sclerosing mesenteritis: Secondary | ICD-10-CM | POA: Diagnosis not present

## 2024-01-11 DIAGNOSIS — I10 Essential (primary) hypertension: Secondary | ICD-10-CM

## 2024-01-11 DIAGNOSIS — K429 Umbilical hernia without obstruction or gangrene: Secondary | ICD-10-CM

## 2024-01-11 DIAGNOSIS — Z7984 Long term (current) use of oral hypoglycemic drugs: Secondary | ICD-10-CM | POA: Insufficient documentation

## 2024-01-11 DIAGNOSIS — Z87891 Personal history of nicotine dependence: Secondary | ICD-10-CM | POA: Diagnosis not present

## 2024-01-11 DIAGNOSIS — G4733 Obstructive sleep apnea (adult) (pediatric): Secondary | ICD-10-CM | POA: Diagnosis not present

## 2024-01-11 DIAGNOSIS — G4730 Sleep apnea, unspecified: Secondary | ICD-10-CM | POA: Insufficient documentation

## 2024-01-11 DIAGNOSIS — Z87828 Personal history of other (healed) physical injury and trauma: Secondary | ICD-10-CM | POA: Diagnosis not present

## 2024-01-11 DIAGNOSIS — E119 Type 2 diabetes mellitus without complications: Secondary | ICD-10-CM | POA: Diagnosis not present

## 2024-01-11 DIAGNOSIS — D171 Benign lipomatous neoplasm of skin and subcutaneous tissue of trunk: Secondary | ICD-10-CM | POA: Diagnosis not present

## 2024-01-11 HISTORY — PX: UMBILICAL HERNIA REPAIR: SHX196

## 2024-01-11 HISTORY — PX: EXCISION OF ABDOMINAL WALL TUMOR: SHX6687

## 2024-01-11 LAB — GLUCOSE, CAPILLARY
Glucose-Capillary: 142 mg/dL — ABNORMAL HIGH (ref 70–99)
Glucose-Capillary: 150 mg/dL — ABNORMAL HIGH (ref 70–99)

## 2024-01-11 SURGERY — REPAIR, HERNIA, UMBILICAL, ADULT
Anesthesia: General | Site: Abdomen

## 2024-01-11 MED ORDER — EPHEDRINE SULFATE-NACL 50-0.9 MG/10ML-% IV SOSY
PREFILLED_SYRINGE | INTRAVENOUS | Status: DC | PRN
  Administered 2024-01-11: 10 mg via INTRAVENOUS

## 2024-01-11 MED ORDER — IBUPROFEN 200 MG PO TABS: 600.0000 mg | ORAL_TABLET | Freq: Four times a day (QID) | ORAL | 0 refills | Status: AC

## 2024-01-11 MED ORDER — DEXAMETHASONE SODIUM PHOSPHATE 10 MG/ML IJ SOLN
INTRAMUSCULAR | Status: DC | PRN
  Administered 2024-01-11: 5 mg via INTRAVENOUS

## 2024-01-11 MED ORDER — LIDOCAINE 2% (20 MG/ML) 5 ML SYRINGE
INTRAMUSCULAR | Status: DC | PRN
  Administered 2024-01-11: 40 mg via INTRAVENOUS

## 2024-01-11 MED ORDER — CHLORHEXIDINE GLUCONATE CLOTH 2 % EX PADS: 6.0000 | MEDICATED_PAD | Freq: Once | CUTANEOUS | Status: DC

## 2024-01-11 MED ORDER — ACETAMINOPHEN 500 MG PO TABS: 1000.0000 mg | ORAL_TABLET | ORAL | Status: DC

## 2024-01-11 MED ORDER — HYDROMORPHONE HCL 1 MG/ML IJ SOLN
0.2500 mg | INTRAMUSCULAR | Status: DC | PRN
  Administered 2024-01-11 (×2): 0.5 mg via INTRAVENOUS

## 2024-01-11 MED ORDER — GABAPENTIN 300 MG PO CAPS
ORAL_CAPSULE | ORAL | Status: AC
  Administered 2024-01-11: 300 mg via ORAL
  Filled 2024-01-11: qty 1

## 2024-01-11 MED ORDER — HYDROMORPHONE HCL 1 MG/ML IJ SOLN
INTRAMUSCULAR | Status: AC
  Filled 2024-01-11: qty 1

## 2024-01-11 MED ORDER — CELECOXIB 200 MG PO CAPS
ORAL_CAPSULE | ORAL | Status: AC
  Administered 2024-01-11: 200 mg via ORAL
  Filled 2024-01-11: qty 1

## 2024-01-11 MED ORDER — FENTANYL CITRATE (PF) 250 MCG/5ML IJ SOLN
INTRAMUSCULAR | Status: DC | PRN
Start: 2024-01-11 — End: 2024-01-11
  Administered 2024-01-11 (×2): 50 ug via INTRAVENOUS
  Administered 2024-01-11: 150 ug via INTRAVENOUS

## 2024-01-11 MED ORDER — FENTANYL CITRATE (PF) 250 MCG/5ML IJ SOLN
INTRAMUSCULAR | Status: AC
  Filled 2024-01-11: qty 5

## 2024-01-11 MED ORDER — GABAPENTIN 300 MG PO CAPS
300.0000 mg | ORAL_CAPSULE | ORAL | Status: AC
Start: 2024-01-11 — End: 2024-01-11

## 2024-01-11 MED ORDER — HYDROMORPHONE HCL 1 MG/ML IJ SOLN
INTRAMUSCULAR | Status: DC | PRN
  Administered 2024-01-11 (×2): .5 mg via INTRAVENOUS

## 2024-01-11 MED ORDER — CELECOXIB 200 MG PO CAPS
200.0000 mg | ORAL_CAPSULE | ORAL | Status: AC
Start: 2024-01-11 — End: 2024-01-11

## 2024-01-11 MED ORDER — OXYCODONE HCL 5 MG PO TABS: 5.0000 mg | ORAL_TABLET | Freq: Three times a day (TID) | ORAL | 0 refills | Status: AC | PRN

## 2024-01-11 MED ORDER — MIDAZOLAM HCL 2 MG/2ML IJ SOLN: 0.5000 mg | Freq: Once | INTRAMUSCULAR | Status: DC | PRN

## 2024-01-11 MED ORDER — LACTATED RINGERS IV SOLN: INTRAVENOUS | Status: DC

## 2024-01-11 MED ORDER — MIDAZOLAM HCL 2 MG/2ML IJ SOLN
INTRAMUSCULAR | Status: AC
  Filled 2024-01-11: qty 2

## 2024-01-11 MED ORDER — CEFAZOLIN SODIUM-DEXTROSE 2-4 GM/100ML-% IV SOLN
INTRAVENOUS | Status: AC
  Filled 2024-01-11: qty 100

## 2024-01-11 MED ORDER — PROPOFOL 10 MG/ML IV BOLUS
INTRAVENOUS | Status: AC
  Filled 2024-01-11: qty 20

## 2024-01-11 MED ORDER — MEPERIDINE HCL 25 MG/ML IJ SOLN: 6.2500 mg | INTRAMUSCULAR | Status: DC | PRN

## 2024-01-11 MED ORDER — 0.9 % SODIUM CHLORIDE (POUR BTL) OPTIME
TOPICAL | Status: DC | PRN
  Administered 2024-01-11: 1000 mL

## 2024-01-11 MED ORDER — INSULIN ASPART 100 UNIT/ML IJ SOLN
0.0000 [IU] | INTRAMUSCULAR | Status: DC | PRN
Start: 2024-01-11 — End: 2024-01-11

## 2024-01-11 MED ORDER — DEXAMETHASONE SODIUM PHOSPHATE 10 MG/ML IJ SOLN
INTRAMUSCULAR | Status: AC
  Filled 2024-01-11: qty 1

## 2024-01-11 MED ORDER — ACETAMINOPHEN 325 MG PO TABS: 650.0000 mg | ORAL_TABLET | Freq: Four times a day (QID) | ORAL | 0 refills | Status: AC

## 2024-01-11 MED ORDER — ONDANSETRON HCL 4 MG/2ML IJ SOLN
INTRAMUSCULAR | Status: DC | PRN
  Administered 2024-01-11: 4 mg via INTRAVENOUS

## 2024-01-11 MED ORDER — OXYCODONE HCL 5 MG PO TABS
ORAL_TABLET | ORAL | Status: AC
  Filled 2024-01-11: qty 1

## 2024-01-11 MED ORDER — OXYCODONE HCL 5 MG/5ML PO SOLN: 5.0000 mg | Freq: Once | ORAL | Status: AC | PRN

## 2024-01-11 MED ORDER — CEFAZOLIN SODIUM-DEXTROSE 2-4 GM/100ML-% IV SOLN
2.0000 g | INTRAVENOUS | Status: AC
  Administered 2024-01-11: 2 g via INTRAVENOUS

## 2024-01-11 MED ORDER — ACETAMINOPHEN 500 MG PO TABS
1000.0000 mg | ORAL_TABLET | Freq: Once | ORAL | Status: AC
  Administered 2024-01-11: 1000 mg via ORAL
  Filled 2024-01-11: qty 2

## 2024-01-11 MED ORDER — PROPOFOL 10 MG/ML IV BOLUS
INTRAVENOUS | Status: DC | PRN
  Administered 2024-01-11: 200 mg via INTRAVENOUS

## 2024-01-11 MED ORDER — ONDANSETRON HCL 4 MG/2ML IJ SOLN
INTRAMUSCULAR | Status: AC
  Filled 2024-01-11: qty 2

## 2024-01-11 MED ORDER — KETOROLAC TROMETHAMINE 30 MG/ML IJ SOLN
30.0000 mg | Freq: Once | INTRAMUSCULAR | Status: AC
  Administered 2024-01-11: 30 mg via INTRAVENOUS

## 2024-01-11 MED ORDER — OXYCODONE HCL 5 MG PO TABS
5.0000 mg | ORAL_TABLET | Freq: Once | ORAL | Status: AC | PRN
  Administered 2024-01-11: 5 mg via ORAL

## 2024-01-11 MED ORDER — ORAL CARE MOUTH RINSE: 15.0000 mL | Freq: Once | OROMUCOSAL | Status: AC

## 2024-01-11 MED ORDER — CHLORHEXIDINE GLUCONATE 0.12 % MT SOLN
15.0000 mL | Freq: Once | OROMUCOSAL | Status: AC
  Administered 2024-01-11: 15 mL via OROMUCOSAL
  Filled 2024-01-11: qty 15

## 2024-01-11 MED ORDER — KETOROLAC TROMETHAMINE 30 MG/ML IJ SOLN
INTRAMUSCULAR | Status: AC
Start: 2024-01-11 — End: 2024-01-11
  Filled 2024-01-11: qty 1

## 2024-01-11 MED ORDER — SUGAMMADEX SODIUM 200 MG/2ML IV SOLN
INTRAVENOUS | Status: DC | PRN
  Administered 2024-01-11: 200 mg via INTRAVENOUS

## 2024-01-11 MED ORDER — HYDROMORPHONE HCL 1 MG/ML IJ SOLN
INTRAMUSCULAR | Status: AC
  Filled 2024-01-11: qty 0.5

## 2024-01-11 MED ORDER — BUPIVACAINE-EPINEPHRINE (PF) 0.25% -1:200000 IJ SOLN
INTRAMUSCULAR | Status: AC
  Filled 2024-01-11: qty 30

## 2024-01-11 MED ORDER — MIDAZOLAM HCL 2 MG/2ML IJ SOLN
INTRAMUSCULAR | Status: DC | PRN
  Administered 2024-01-11: 2 mg via INTRAVENOUS

## 2024-01-11 MED ORDER — PHENYLEPHRINE 80 MCG/ML (10ML) SYRINGE FOR IV PUSH (FOR BLOOD PRESSURE SUPPORT)
PREFILLED_SYRINGE | INTRAVENOUS | Status: DC | PRN
  Administered 2024-01-11: 80 ug via INTRAVENOUS

## 2024-01-11 MED ORDER — BUPIVACAINE-EPINEPHRINE 0.25% -1:200000 IJ SOLN
INTRAMUSCULAR | Status: DC | PRN
  Administered 2024-01-11: 30 mL

## 2024-01-11 MED ORDER — ROCURONIUM BROMIDE 10 MG/ML (PF) SYRINGE
PREFILLED_SYRINGE | INTRAVENOUS | Status: DC | PRN
  Administered 2024-01-11: 20 mg via INTRAVENOUS
  Administered 2024-01-11: 60 mg via INTRAVENOUS

## 2024-01-11 SURGICAL SUPPLY — 32 items
BAG COUNTER SPONGE SURGICOUNT (BAG) ×1 IMPLANT
BLADE CLIPPER SURG (BLADE) IMPLANT
CANISTER SUCT 3000ML PPV (MISCELLANEOUS) IMPLANT
CHLORAPREP W/TINT 26 (MISCELLANEOUS) ×1 IMPLANT
COVER SURGICAL LIGHT HANDLE (MISCELLANEOUS) ×1 IMPLANT
DERMABOND ADVANCED .7 DNX12 (GAUZE/BANDAGES/DRESSINGS) ×1 IMPLANT
DRAPE LAPAROTOMY 100X72 PEDS (DRAPES) ×1 IMPLANT
ELECTRODE REM PT RTRN 9FT ADLT (ELECTROSURGICAL) ×1 IMPLANT
GAUZE 4X4 16PLY ~~LOC~~+RFID DBL (SPONGE) ×1 IMPLANT
GAUZE SPONGE 2X2 8PLY STRL LF (GAUZE/BANDAGES/DRESSINGS) ×1 IMPLANT
GLOVE BIO SURGEON STRL SZ7 (GLOVE) ×1 IMPLANT
GLOVE BIOGEL PI IND STRL 7.5 (GLOVE) ×1 IMPLANT
GOWN STRL REUS W/ TWL LRG LVL3 (GOWN DISPOSABLE) ×2 IMPLANT
GOWN STRL REUS W/ TWL XL LVL3 (GOWN DISPOSABLE) ×1 IMPLANT
HANDLE SUCTION POOLE (INSTRUMENTS) IMPLANT
KIT BASIN OR (CUSTOM PROCEDURE TRAY) ×1 IMPLANT
KIT TURNOVER KIT B (KITS) ×1 IMPLANT
MESH VENTRALEX ST 1-7/10 CRC S (Mesh General) IMPLANT
NDL HYPO 22X1.5 SAFETY MO (MISCELLANEOUS) ×1 IMPLANT
NEEDLE HYPO 22X1.5 SAFETY MO (MISCELLANEOUS) ×1 IMPLANT
NS IRRIG 1000ML POUR BTL (IV SOLUTION) ×1 IMPLANT
PACK GENERAL/GYN (CUSTOM PROCEDURE TRAY) ×1 IMPLANT
PAD ARMBOARD POSITIONER FOAM (MISCELLANEOUS) ×1 IMPLANT
SUT MNCRL AB 4-0 PS2 18 (SUTURE) ×1 IMPLANT
SUT NOVA NAB DX-16 0-1 5-0 T12 (SUTURE) IMPLANT
SUT PDS AB 1 CT1 36 (SUTURE) IMPLANT
SUT PDS AB 2-0 CT2 27 (SUTURE) IMPLANT
SUT PDS PLUS AB 0 CT-2 (SUTURE) IMPLANT
SUT VIC AB 3-0 SH 8-18 (SUTURE) ×1 IMPLANT
SYR CONTROL 10ML LL (SYRINGE) ×1 IMPLANT
TOWEL GREEN STERILE (TOWEL DISPOSABLE) ×1 IMPLANT
TOWEL GREEN STERILE FF (TOWEL DISPOSABLE) ×1 IMPLANT

## 2024-01-11 NOTE — Discharge Instructions (Signed)
 Outpatient Surgery Home Care Instruction  Activity  The effects of anesthesia are still present and drowsiness may result.  Limit activity for the first 24 hours, then you may return to normal daily activities. Returning to normal daily activities as soon as you can following surgery will enhance recovery time.  Do not drive or operate heavy machinery within 24 hours of taking narcotic pain medications.   Do not mow the lawn, use a vacuum cleaner, or do any other strenuous activities without first consulting your surgical team.   Diet Drink plenty of fluids and eat light meals today, then resume regular diet. Some patients may find their appetite is poor for a week or two after surgery. This is a normal result of the stress of surgery-your appetite will return in time.   There are no specific diet restrictions after surgery.   Dressing and Wound Care  Keep your wound or incision site clean and dry.  You may have different types of dressings covering your incisions depending on your operation and your surgeon: Dermabond/Durabond (skin glue): This will usually remain in place for 10-14 days, then naturally fall off your skin. You may take a shower 24 hrs after surgery, carefully wash, not scrub the incision site with a mild non-scented soap. Pat dry with a soft towel.  Do not pick or peel skin glue off.  You can shower and let the water fall on the dressings above. Do not soak or submerge your incision(s) in a bath tub, hot tub, or swimming pool, until your doctor says it is ok to do so or the incision(s) have completely healed, usually about 2-4 weeks.  Do not use creams, powder, salves or balms on your incision(s).   What to Expect After Surgery   Moderate discomfort controlled with medications  Minimal drainage from incision  Feeling fatigue and weak  Constipation after surgery is common. Drink plenty fluids and eat a high fiber diet.   Pain Control: Prescribed Non-Narcotic Pain  Medication  You will be given three prescriptions.  Two of them will be for prescription strength ibuprofen (i.e. Advil) and prescription strength acetaminophen  (i.e. Tylenol ).  The vast majority of patients will just need these two medications.  One prescription will be for a 'rescue' prescription of an oral narcotic (oxycodone ).  You may fill this if needed.  You will alternate taking the ibuprofen (600mg ) every 6 hours and also the acetaminophen  (650mg ) every 6 hours so that you are taking one of those medications every 3 hours.  For example: o 0800 - take ibuprofen 600mg  o 1100 - take acetaminophen  650mg  o 1400 - take ibuprofen 600mg  o 1700 - take acetaminophen  650mg  o Etc.  Continue taking this alternating pattern of ibuprofen and acetaminophen  for 3 days  If you cannot take one or the other of these medications, just take the one you can every 6 hours.  If you are comfortable at night, you don't have to wake up and take a medication.  If you are still uncomfortable after taking either ibuprofen or acetaminophen , try gentle stretching exercise and ice packs (a bag of frozen vegetables works great).  If you are still uncomfortable, you may fill the narcotic prescription of Oxycodone  and take as directed.  Once you have completed these prescriptions, your pain level should be low enough to stop taking medications altogether or just use an over the counter medication (ibuprofen or acetaminophen ) as needed.    Pain Control: Over the Counter Medications to take as  needed  Colace/Docusate: May be prescribed by your surgeon to prevent constipation caused by the combination of narcotics, effects of anesthesia, and decreased ambulation.  Hold for loose stools or diarrhea. Take 100 mg 1-2 times a day starting tonight.   Fiber: High fiber foods, extra liquids (water 9-13 cups/day) can also assist with constipation. Examples of high fiber foods are fruit, bran. Prune juice and water are also good liquids  to drink.  Milk of Magnesia/Miralax:  If constipated despite takeing the over the counter stool softeners, you may take Milk of Magnesia or Miralax as directed on bottle to assist with constipation.     Pepcid /Famotidine : May be prescribed while taking naproxen (Aleve) or other NSAIDs such as ibuprofen (Motrin/Advil) to prevent stomach upset or Acid-reflux symptoms. Take 1 tablet 1-2 times a day.   **Constipation: The first bowel movement may occur anywhere between 1-5 days after surgery.  As long as you are not nauseated or not having significant abdominal pain this variation is acceptable. Narcotic pain medications can cause constipation increasing discomfort; early discontinuation will assist with bowel management. If constipated despite taking stool softeners, you may take Milk of Magnesia or Miralax as directed on the bottle.     **Home medications: You may restart your home medications as directed by your respective Primary Care Physician or Surgeon.   When to notify your Doctor or Healthcare Team   Sign of Wound Infection   Fever over 100 degrees.  Wound becomes extremely swollen, shows red streaks, warm to the touch, and/or drainage from the incision site or foul-smelling drainage.  Wound edges separate or opens up  Bleeding or bruising   If you have bleeding, apply pressure to the site and hold the pressure firmly for 5 minutes. If the bleeding continues, apply pressure again and call 911. If the bleeding stopped, call your doctor to report it.   Call your doctor or nurse if you have increased bleeding from your site and increased bruising or a lump forms or gets larger under your skin at the site. Unrelieved Pain   Call your doctor or nurse if your pain gets worse or is not eased 1 hour after taking your pain medicine, or if it is severe and uncontrolled. Nausea and Vomiting   Call your doctor or nurse if you have nausea and vomiting that continues more than 24 hours, will not let you  keep medicine down and will not let you keep fluids down  Fever, Flu-like symptoms   Fever over 100 degrees and/or chills  Gastrointestinal Bleeding Symptoms    Black tarry bowel movements.  This can be normal after surgery on the stomach, but should resolve in a day or two.    Call 911 if you suddenly have signs of blood loss such as:  Vomiting blood  Fast heart rate  Feeling faint, sweaty, or blacking out  Passing bright red blood from your rectum  Blood Clot Symptoms   Tender, swollen or reddened areas in your calf muscle or thighs.  Numbness or tingling in your lower leg or calf, or at the top of your leg or groin  Skin on your leg looks pale or blue or feels cold to touch  Chest pain or have trouble breathing, lightheadedness, fast heart rate  Sudden Onset of Symptoms    Call 911 if you suddenly have:  Leg weakness and spasm  Loss of bladder or bowel function  Seizure  Confusion, severe headache, dizziness or feeling unsteady, problems talking, difficulty  swallowing, and/or numbness or muscle weakness as these could be signs of a stroke.  Follow up Appointment Your follow up appointment should be scheduled 2-3 weeks after your surgery date.  If you have not previously scheduled for a follow-up visit you can be scheduled by contacting [phone].

## 2024-01-11 NOTE — Anesthesia Postprocedure Evaluation (Signed)
 Anesthesia Post Note  Patient: Antonio Lee  Procedure(s) Performed: REPAIR, HERNIA, UMBILICAL, ADULT (Abdomen) EXCISION, NEOPLASM, ABDOMINAL WALL (Abdomen)     Patient location during evaluation: PACU Anesthesia Type: General Level of consciousness: awake and alert, patient cooperative and oriented Pain management: pain level controlled Vital Signs Assessment: post-procedure vital signs reviewed and stable Respiratory status: spontaneous breathing, nonlabored ventilation and respiratory function stable Cardiovascular status: blood pressure returned to baseline and stable Postop Assessment: no apparent nausea or vomiting and able to ambulate Anesthetic complications: no   No notable events documented.  Last Vitals:  Vitals:   01/11/24 1515 01/11/24 1530  BP: (!) 147/84 (!) 140/88  Pulse: 86 81  Resp: 17 16  Temp:  36.4 C  SpO2: 93% 100%    Last Pain:  Vitals:   01/11/24 1445  TempSrc:   PainSc: 9                  Raun Routh,E. Antwaine Boomhower

## 2024-01-11 NOTE — Anesthesia Procedure Notes (Signed)
 Procedure Name: Intubation Date/Time: 01/11/2024 12:57 PM  Performed by: Alphia Jasmine, CRNAPre-anesthesia Checklist: Patient identified, Emergency Drugs available, Suction available, Timeout performed and Patient being monitored Patient Re-evaluated:Patient Re-evaluated prior to induction Oxygen Delivery Method: Circle system utilized Preoxygenation: Pre-oxygenation with 100% oxygen Induction Type: IV induction Ventilation: Mask ventilation without difficulty and Oral airway inserted - appropriate to patient size Laryngoscope Size: Mac and 4 Grade View: Grade I Tube type: Oral Tube size: 7.5 mm Airway Equipment and Method: Stylet Placement Confirmation: ETT inserted through vocal cords under direct vision, positive ETCO2, CO2 detector and breath sounds checked- equal and bilateral Secured at: 23 cm Tube secured with: Tape Dental Injury: Teeth and Oropharynx as per pre-operative assessment

## 2024-01-11 NOTE — Op Note (Signed)
 Post-Op Note/Post-Procedure Note  Patient: Antonio Lee MRN: [id number] DOB: Jan 30, 1961 Sex: male Operation/Procedure Date: 01/11/2024 Surgeons and Role:    * Antonio Lee, Antonio Harold, MD - Primary  Pre-operative Diagnoses: UMBILICAL HERNIA, ABDOMINAL WALL MASS x 2 (2cm x 2cm 2cm & 3cm x 3cm x 3cm) Postoperative Diagnoses: Same  Procedure:    REPAIR, HERNIA, UMBILICAL, ADULT CPT(R) Code:  74259 - PR RPR AA HERNIA 1ST < 3 CM REDUCIBLE  Procedure:    EXCISION, NEOPLASM, ABDOMINAL WALL CPT(R) Code:  56387 - PR EXC TUMOR SOFT TISSUE ABDOMINAL WALL SUBQ <3CM   Anesthesia: General endotracheal anesthesia  Indications: Antonio Lee is a 63 y.o. year old male who presents for umbilical hernia repair as well as excision of two symptomatic abdominal wall masses. Preoperatively, I discussed in detail the risks, benefits, alternatives, and potential complications. The patient understands and requests to proceed.  Operative Findings: Umbilical hernia with fascial defect of 2cm (craniocaudal) x 2cm (transverse). Two lipomatous masses removed from the abdominal wall.   Technique: I began with excision of the two abdominal wall masses. The patient was identified in preop holding and taken to the OR where he was placed on the operating room table. SCDs were placed. General endotracheal anesthesia was induced without difficulty. He was then prepped and draped in the usual sterile fashion. A surgical timeout was performed indicating the correct patient, procedure, positioning and need for preoperative antibiotics.  The previously marked area near the left side of the abdomen was identified. A incision was made overlying the mass using a #15 blade and carried down through the subcutaneous tissue using electrocautery. The mass was encountered and appeared lipomatous. The mass was freed from surrounding tissues using blunt dissection and electrocautery, excised, and then passed off the field to be sent as a  specimen. The mass measured 2cm x 2cm x 2cm. The wound was irrigated with sterile saline and inspected for hemostasis. The deep layers were closed with interrupted 3-0 vicryl. Interrupted 3-0 vicryl were used to approximate the dermis followed by a running 4-0 subcuticular monocryl. A layer of dermabond was applied. The same process was repeated for the other abdominal wall mass. It too was lipomatous and measured approximately 3cm x 3cm x 3cm.    Next, I moved on to the umbilical hernia repair. I began by making a curvilinear incision inferior to the umbilicus. I isolated the umbilical stalk and transected it at the base. I soon encountered the hernia sac. The hernia sac was dissected free from the subcutaneous attachments circumferentially down to the rim of the fascial defect. We were able to maintain a preperitoneal plane circumferentially. The dissected peritoneum was developed for 5cm circumferentially. We ensured hemostasis after this dissection. The patient had fairly healthy fascia circumferentially. This hernia defect was approximately 2 cm in size. We ensured hemostasis in the preperitoneal space. A Ventralex ST 4.3cm mesh was selected and was placed as a sublay within the preperitoneal space with its visceral surface facing the peritoneum. The mesh was fixated superiorly and inferiorly using 0 PDS suture. The fascia was closed using interrupted 0-PDS suture after abrading the edges with the Bovie. We then approximated the redundant skin at the umbilicus to the anterior fascia using 3-0 Vicryl suture in a cosmetically appealing fashion. Hemostasis was observed. We then placed 0.25% Marcaine  plain in the subcutaneous tissues. The skin was closed using interrupted 3-0 Vicryl deep dermal sutures followed by a running 4-0 Monocryl subcuticular suture. Dermabond was applied.   The patient tolerated  the procedure well and was extubated and taken to recovery room in good condition. All sponge, needle, and  instrument counts were reported correct at the end of the case.  Estimated Blood Loss: Minimal. Specimens:Abdominal wall mass Implants: Ventralex ST 4.3cm Drains: None. Complications: * No complications entered in OR log * Condition of the patient: Good, extubated Disposition: PACU

## 2024-01-11 NOTE — Transfer of Care (Signed)
 Immediate Anesthesia Transfer of Care Note  Patient: Antonio Lee  Procedure(s) Performed: REPAIR, HERNIA, UMBILICAL, ADULT (Abdomen) EXCISION, NEOPLASM, ABDOMINAL WALL (Abdomen)  Patient Location: PACU  Anesthesia Type:General  Level of Consciousness: awake and alert   Airway & Oxygen Therapy: Patient Spontanous Breathing and Patient connected to nasal cannula oxygen  Post-op Assessment: Report given to RN and Post -op Vital signs reviewed and stable  Post vital signs: Reviewed and stable  Last Vitals:  Vitals Value Taken Time  BP 181/88 01/11/24 1426  Temp    Pulse 84 01/11/24 1429  Resp 19 01/11/24 1429  SpO2 93 % 01/11/24 1429  Vitals shown include unfiled device data.  Last Pain:  Vitals:   01/11/24 1200  TempSrc:   PainSc: 3          Complications: No notable events documented.
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
## 2024-01-14 ENCOUNTER — Encounter (HOSPITAL_COMMUNITY): Payer: Self-pay | Admitting: General Surgery

## 2024-01-14 LAB — SURGICAL PATHOLOGY

## 2024-01-24 DIAGNOSIS — E119 Type 2 diabetes mellitus without complications: Secondary | ICD-10-CM | POA: Diagnosis not present

## 2024-01-28 ENCOUNTER — Encounter: Payer: Self-pay | Admitting: Podiatry

## 2024-01-28 ENCOUNTER — Ambulatory Visit: Admitting: Podiatry

## 2024-01-28 VITALS — Ht 70.0 in | Wt 208.0 lb

## 2024-01-28 DIAGNOSIS — B351 Tinea unguium: Secondary | ICD-10-CM | POA: Diagnosis not present

## 2024-01-28 DIAGNOSIS — L600 Ingrowing nail: Secondary | ICD-10-CM | POA: Diagnosis not present

## 2024-01-29 NOTE — Progress Notes (Signed)
 Subjective:   Patient ID: Antonio Lee, male   DOB: 64 y.o.   MRN: [id number]   HPI Patient presents with severe damage of the hallux nails of both feet and states that he knows at 1 point in the future he will probably need to have them removed but he wanted it checked   Review of Systems  All other systems reviewed and are negative.       Objective:  Physical Exam Vitals and nursing note reviewed.  Constitutional:      Appearance: He is well-developed.  Pulmonary:     Effort: Pulmonary effort is normal.  Musculoskeletal:        General: Normal range of motion.  Skin:    General: Skin is warm.  Neurological:     Mental Status: He is alert.     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  He needs to hold off on this currently and I educated him on recovery and risk and he is going to schedule to have these nails taken care of all questions answered today
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
## 2024-02-06 ENCOUNTER — Ambulatory Visit: Payer: 59 | Attending: Nurse Practitioner | Admitting: Nurse Practitioner

## 2024-02-06 DIAGNOSIS — E118 Type 2 diabetes mellitus with unspecified complications: Secondary | ICD-10-CM

## 2024-02-06 DIAGNOSIS — I639 Cerebral infarction, unspecified: Secondary | ICD-10-CM

## 2024-02-06 DIAGNOSIS — I10 Essential (primary) hypertension: Secondary | ICD-10-CM

## 2024-02-06 DIAGNOSIS — E785 Hyperlipidemia, unspecified: Secondary | ICD-10-CM
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
## 2024-02-13 ENCOUNTER — Ambulatory Visit: Payer: 59 | Admitting: Neurology

## 2024-02-13 DIAGNOSIS — G4733 Obstructive sleep apnea (adult) (pediatric): Secondary | ICD-10-CM | POA: Diagnosis not present

## 2024-02-20 DIAGNOSIS — A048 Other specified bacterial intestinal infections: Secondary | ICD-10-CM | POA: Diagnosis not present

## 2024-02-25 DIAGNOSIS — R197 Diarrhea, unspecified: Secondary | ICD-10-CM | POA: Diagnosis not present

## 2024-02-25 DIAGNOSIS — R109 Unspecified abdominal pain: Secondary | ICD-10-CM | POA: Diagnosis not present

## 2024-02-26 ENCOUNTER — Ambulatory Visit
Admission: RE | Admit: 2024-02-26 | Discharge: 2024-02-26 | Disposition: A | Discharge status: Home / Self Care | Source: Ambulatory Visit | Attending: Nurse Practitioner | Admitting: Nurse Practitioner

## 2024-02-26 ENCOUNTER — Other Ambulatory Visit: Payer: Self-pay | Admitting: Nurse Practitioner

## 2024-02-26 DIAGNOSIS — R197 Diarrhea, unspecified: Secondary | ICD-10-CM | POA: Diagnosis not present

## 2024-02-26 DIAGNOSIS — R109 Unspecified abdominal pain: Secondary | ICD-10-CM | POA: Diagnosis not present

## 2024-02-27 DIAGNOSIS — R748 Abnormal levels of other serum enzymes: Secondary | ICD-10-CM | POA: Diagnosis not present

## 2024-02-28 ENCOUNTER — Other Ambulatory Visit: Payer: Self-pay | Admitting: Nurse Practitioner

## 2024-02-28 DIAGNOSIS — R109 Unspecified abdominal pain: Secondary | ICD-10-CM

## 2024-02-28 DIAGNOSIS — R748 Abnormal levels of other serum enzymes: Secondary | ICD-10-CM

## 2024-03-03 ENCOUNTER — Ambulatory Visit
Admission: RE | Admit: 2024-03-03 | Discharge: 2024-03-03 | Disposition: A | Discharge status: Home / Self Care | Source: Ambulatory Visit | Attending: Nurse Practitioner | Admitting: Nurse Practitioner

## 2024-03-03 DIAGNOSIS — R109 Unspecified abdominal pain: Secondary | ICD-10-CM

## 2024-03-03 DIAGNOSIS — R748 Abnormal levels of other serum enzymes: Secondary | ICD-10-CM

## 2024-03-03 DIAGNOSIS — K769 Liver disease, unspecified: Secondary | ICD-10-CM | POA: Diagnosis not present

## 2024-04-28 DIAGNOSIS — M5126 Other intervertebral disc displacement, lumbar region: Secondary | ICD-10-CM | POA: Diagnosis not present

## 2024-04-28 DIAGNOSIS — G629 Polyneuropathy, unspecified: Secondary | ICD-10-CM | POA: Diagnosis not present

## 2024-04-28 DIAGNOSIS — G8929 Other chronic pain: Secondary | ICD-10-CM | POA: Diagnosis not present

## 2024-05-15 DIAGNOSIS — G4733 Obstructive sleep apnea (adult) (pediatric): Secondary | ICD-10-CM | POA: Diagnosis not present
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
## 2024-05-20 ENCOUNTER — Ambulatory Visit (INDEPENDENT_AMBULATORY_CARE_PROVIDER_SITE_OTHER): Admitting: Neurology

## 2024-05-20 ENCOUNTER — Telehealth: Payer: Self-pay | Admitting: Neurology

## 2024-05-20 ENCOUNTER — Encounter: Payer: Self-pay | Admitting: Neurology

## 2024-05-20 VITALS — BP 136/78 | HR 87 | Ht 70.0 in | Wt 203.0 lb

## 2024-05-20 DIAGNOSIS — R413 Other amnesia: Secondary | ICD-10-CM

## 2024-05-20 NOTE — Patient Instructions (Signed)
 Check MRI of the brain, will contact you after report

## 2024-05-20 NOTE — Telephone Encounter (Signed)
 no auth required sent to GI [phone]

## 2024-05-29 ENCOUNTER — Ambulatory Visit
Admission: RE | Admit: 2024-05-29 | Discharge: 2024-05-29 | Disposition: A | Discharge status: Home / Self Care | Source: Ambulatory Visit | Attending: Neurology | Admitting: Neurology

## 2024-05-29 DIAGNOSIS — R413 Other amnesia: Secondary | ICD-10-CM

## 2024-05-29 MED ORDER — GADOPICLENOL 0.5 MMOL/ML IV SOLN
9.0000 mL | Freq: Once | INTRAVENOUS | Status: AC | PRN
  Administered 2024-05-29: 9 mL via INTRAVENOUS
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
## 2024-06-04 NOTE — Addendum Note (Signed)
 Addended by: GAYLAND LAURAINE PARAS on: 06/04/2024 09:16 AM   Modules accepted: Orders

## 2024-06-05 ENCOUNTER — Encounter: Payer: Self-pay | Admitting: Nurse Practitioner

## 2024-06-05 ENCOUNTER — Ambulatory Visit: Attending: Nurse Practitioner | Admitting: Nurse Practitioner

## 2024-06-05 ENCOUNTER — Other Ambulatory Visit: Payer: Self-pay

## 2024-06-05 ENCOUNTER — Other Ambulatory Visit (HOSPITAL_COMMUNITY): Payer: Self-pay

## 2024-06-05 VITALS — BP 128/80 | HR 77 | Ht 70.0 in | Wt 204.0 lb

## 2024-06-05 DIAGNOSIS — E785 Hyperlipidemia, unspecified: Secondary | ICD-10-CM | POA: Diagnosis not present

## 2024-06-05 DIAGNOSIS — I639 Cerebral infarction, unspecified: Secondary | ICD-10-CM

## 2024-06-05 DIAGNOSIS — G4733 Obstructive sleep apnea (adult) (pediatric): Secondary | ICD-10-CM

## 2024-06-05 DIAGNOSIS — R072 Precordial pain: Secondary | ICD-10-CM

## 2024-06-05 DIAGNOSIS — I10 Essential (primary) hypertension: Secondary | ICD-10-CM | POA: Diagnosis not present

## 2024-06-05 DIAGNOSIS — E118 Type 2 diabetes mellitus with unspecified complications: Secondary | ICD-10-CM | POA: Diagnosis not present

## 2024-06-05 MED ORDER — FUROSEMIDE 20 MG PO TABS: 20.0000 mg | ORAL_TABLET | Freq: Every day | ORAL | 1 refills | Status: DC | PRN

## 2024-06-05 MED ORDER — ISOSORBIDE MONONITRATE ER 30 MG PO TB24
30.0000 mg | ORAL_TABLET | Freq: Every day | ORAL | 1 refills | Status: DC
  Filled 2024-06-05: qty 30, 30d supply, fill #0
  Filled 2024-06-30: qty 30, 30d supply, fill #1

## 2024-06-05 MED ORDER — METOPROLOL TARTRATE 50 MG PO TABS
ORAL_TABLET | ORAL | 0 refills | Status: DC
Start: 2024-06-05 — End: 2024-07-28
  Filled 2024-06-05: qty 1, 1d supply, fill #0

## 2024-06-05 MED ORDER — FUROSEMIDE 20 MG PO TABS
20.0000 mg | ORAL_TABLET | Freq: Every day | ORAL | 1 refills | Status: DC | PRN
  Filled 2024-06-05: qty 90, 90d supply, fill #0

## 2024-06-05 MED ORDER — METOPROLOL TARTRATE 50 MG PO TABS: ORAL_TABLET | ORAL | 0 refills | Status: DC

## 2024-06-05 NOTE — Patient Instructions (Addendum)
 Medication Instructions:  START Imdur  30mg  Take 1 tablet once a day  You can take Clartin, Xyzal, Allegra, Zyrtec or Singulair overt the counter for allergies YOU WILL TAKE METOPROLOL  50MG  2 HOURS PRIOR TO YOUR TEST *If you need a refill on your cardiac medications before your next appointment, please call your pharmacy*  Lab Work: BMET ONCE YOUR TEST HAS BEEN SCHEDULED If you have labs (blood work) drawn today and your tests are completely normal, you will receive your results only by: MyChart Message (if you have MyChart) OR A paper copy in the mail If you have any lab test that is abnormal or we need to change your treatment, we will call you to review the results.  Testing/Procedures: Coronary CTA    Follow-Up: At [facility], you and your health needs are our priority.  As part of our continuing mission to provide you with exceptional heart care, our providers are all part of one team.  This team includes your primary Cardiologist (physician) and Advanced Practice Providers or APPs (Physician Assistants and Nurse Practitioners) who all work together to provide you with the care you need, when you need it.  Your next appointment:   3 month(s)  Provider:   Georganna Archer, MD  We recommend signing up for the patient portal called MyChart.  Sign up information is provided on this After Visit Summary.  MyChart is used to connect with patients for Virtual Visits (Telemedicine).  Patients are able to view lab/test results, encounter notes, upcoming appointments, etc.  Non-urgent messages can be sent to your provider as well.   To learn more about what you can do with MyChart, go to [email or web address].   Other Instructions:  You have been referred to PULMONOLOGY.  We recommend that you decrease your intake of salt.  1. Use Morton's Salt substitute.   2. Use Ms. Dash   3. Mccormick;s salt free seasoning   4. Avoid processed meat - bacon ( including mal[place]  bacon) , sausage, ham, hot dogs, Spam  5. Avoid [place]bells soup 6. Avoid prepared meals 7. Eat fresh or frozen foods that specifically do not have any added salt.     Your cardiac CT will be scheduled at one of the below locations:    [facility] [address] [place], [place] [address] [phone]  OR   [facility] [address]  [place], [place] [address] [phone]   If scheduled at [facility], please arrive at the [facility] En[facility] (Entrance C2) of [facility] 30 minutes prior to test start time. You can use the FREE valet parking offered at entrance C (encouraged to control the heart rate for the test)  Proceed to the [facility] Radiology Department (first floor) to check-in and test prep.  All radiology patients and guests should use entrance C2 at [facility], accessed from [facility], even though the hospital's physical address listed is [address]  If scheduled at the Heart and Vascular Tower at [address], please enter the parking lot using the [address] entrance and use the FREE valet service at the patient drop-off area. Enter the building and check-in with registration on the main floor.  If scheduled at [facility], please arrive to the Heart and Vascular Center 15 mins early for check-in and test prep.  There is spacious parking and easy access to the radiology department from the [facility] entrance. Please enter here and check-in with the desk attendant.   If scheduled at [facility], please arrive 30 minutes early for check-in and test prep.  Please follow these instructions carefully (unless otherwise directed):  An IV will be required for this test and Nitroglycerin  will be given.  Hold all erectile dysfunction medications at least 3 days (72 hrs) prior to test. (Ie viagra, cialis,  sildenafil, tadalafil, etc)   On the Night Before the Test: Be sure to Drink plenty of water. Do not consume any caffeinated/decaffeinated beverages or chocolate 12 hours prior to your test. Do not take any antihistamines 12 hours prior to your test.  On the Day of the Test: Drink plenty of water until 1 hour prior to the test. Do not eat any food 1 hour prior to test. You may take your regular medications prior to the test.  Take metoprolol  (Lopressor ) two hours prior to test. If you take Furosemide /Hydrochlorothiazide/Spironolactone/Chlorthalidone, please HOLD on the morning of the test.     After the Test: Drink plenty of water. After receiving IV contrast, you may experience a mild flushed feeling. This is normal. On occasion, you may experience a mild rash up to 24 hours after the test. This is not dangerous. If this occurs, you can take Benadryl 25 mg, Zyrtec, Claritin, or Allegra and increase your fluid intake. (Patients taking Tikosyn should avoid Benadryl, and may take Zyrtec, Claritin, or Allegra) If you experience trouble breathing, this can be serious. If it is severe call 911 IMMEDIATELY. If it is mild, please call our office.  We will call to schedule your test 2-4 weeks out understanding that some insurance companies will need an authorization prior to the service being performed.   For more information and frequently asked questions, please visit our website : [email or web address]  For non-scheduling related questions, please contact the cardiac imaging nurse navigator should you have any questions/concerns: Cardiac Imaging Nurse Navigators Direct Office Dial: [phone]   For scheduling needs, including cancellations and rescheduling, please call [place], [phone].

## 2024-06-11 ENCOUNTER — Encounter (HOSPITAL_COMMUNITY): Payer: Self-pay

## 2024-06-13 ENCOUNTER — Ambulatory Visit (HOSPITAL_COMMUNITY): Admission: RE | Admit: 2024-06-13 | Source: Ambulatory Visit

## 2024-06-19 ENCOUNTER — Encounter: Payer: Self-pay | Admitting: Psychology

## 2024-06-20 ENCOUNTER — Ambulatory Visit (HOSPITAL_COMMUNITY)
Admission: RE | Admit: 2024-06-20 | Discharge: 2024-06-20 | Disposition: A | Discharge status: Home / Self Care | Source: Ambulatory Visit | Attending: Nurse Practitioner | Admitting: Nurse Practitioner

## 2024-06-20 DIAGNOSIS — R072 Precordial pain: Secondary | ICD-10-CM

## 2024-06-20 DIAGNOSIS — I10 Essential (primary) hypertension: Secondary | ICD-10-CM | POA: Insufficient documentation

## 2024-06-20 DIAGNOSIS — I639 Cerebral infarction, unspecified: Secondary | ICD-10-CM

## 2024-06-20 DIAGNOSIS — I2510 Atherosclerotic heart disease of native coronary artery without angina pectoris: Secondary | ICD-10-CM | POA: Insufficient documentation

## 2024-06-20 DIAGNOSIS — Z8673 Personal history of transient ischemic attack (TIA), and cerebral infarction without residual deficits: Secondary | ICD-10-CM | POA: Insufficient documentation

## 2024-06-20 DIAGNOSIS — R079 Chest pain, unspecified: Secondary | ICD-10-CM | POA: Insufficient documentation

## 2024-06-20 DIAGNOSIS — E785 Hyperlipidemia, unspecified: Secondary | ICD-10-CM | POA: Insufficient documentation

## 2024-06-20 DIAGNOSIS — G4733 Obstructive sleep apnea (adult) (pediatric): Secondary | ICD-10-CM | POA: Insufficient documentation

## 2024-06-20 DIAGNOSIS — E118 Type 2 diabetes mellitus with unspecified complications: Secondary | ICD-10-CM | POA: Insufficient documentation

## 2024-06-20 LAB — POCT I-STAT CREATININE: Creatinine, Ser: 0.7 mg/dL (ref 0.61–1.24)

## 2024-06-20 MED ORDER — IOHEXOL 350 MG/ML SOLN
100.0000 mL | Freq: Once | INTRAVENOUS | Status: AC | PRN
  Administered 2024-06-20: 100 mL via INTRAVENOUS

## 2024-06-20 MED ORDER — NITROGLYCERIN 0.4 MG SL SUBL
0.8000 mg | SUBLINGUAL_TABLET | Freq: Once | SUBLINGUAL | Status: AC
  Administered 2024-06-20: 0.8 mg via SUBLINGUAL

## 2024-06-23 ENCOUNTER — Ambulatory Visit: Payer: Self-pay | Admitting: Nurse Practitioner

## 2024-06-23 ENCOUNTER — Ambulatory Visit (HOSPITAL_COMMUNITY)
Admission: RE | Admit: 2024-06-23 | Discharge: 2024-06-23 | Disposition: A | Source: Ambulatory Visit | Attending: Student in an Organized Health Care Education/Training Program

## 2024-06-23 ENCOUNTER — Other Ambulatory Visit: Payer: Self-pay | Admitting: Student in an Organized Health Care Education/Training Program

## 2024-06-23 DIAGNOSIS — R931 Abnormal findings on diagnostic imaging of heart and coronary circulation: Secondary | ICD-10-CM | POA: Diagnosis not present

## 2024-06-23 DIAGNOSIS — G4733 Obstructive sleep apnea (adult) (pediatric): Secondary | ICD-10-CM | POA: Diagnosis not present

## 2024-06-23 DIAGNOSIS — J4540 Moderate persistent asthma, uncomplicated: Secondary | ICD-10-CM | POA: Diagnosis not present

## 2024-06-23 DIAGNOSIS — J45901 Unspecified asthma with (acute) exacerbation: Secondary | ICD-10-CM | POA: Diagnosis not present

## 2024-06-23 DIAGNOSIS — R079 Chest pain, unspecified: Secondary | ICD-10-CM | POA: Diagnosis not present

## 2024-06-23 DIAGNOSIS — J069 Acute upper respiratory infection, unspecified: Secondary | ICD-10-CM | POA: Diagnosis not present

## 2024-06-23 DIAGNOSIS — Z87440 Personal history of urinary (tract) infections: Secondary | ICD-10-CM | POA: Diagnosis not present

## 2024-06-23 NOTE — Progress Notes (Signed)
CT FFR ordered.  

## 2024-06-24 NOTE — Telephone Encounter (Addendum)
 Spoke with pt regarding CT results. Pt verbalized understanding. Pt stated he is not taking all of the medications listed. Pts med list reviewed with pt and updated. Pt was advised to take medications as prescribed and bring current medications to his f/u appt with Dr. Georganna Archer. Pt agreed. Pt had no further questions of concerns.

## 2024-06-26 ENCOUNTER — Ambulatory Visit (INDEPENDENT_AMBULATORY_CARE_PROVIDER_SITE_OTHER)

## 2024-06-26 ENCOUNTER — Ambulatory Visit

## 2024-06-26 VITALS — BP 132/88 | HR 75 | Ht 70.0 in | Wt 205.0 lb

## 2024-06-26 DIAGNOSIS — D869 Sarcoidosis, unspecified: Secondary | ICD-10-CM | POA: Diagnosis not present

## 2024-06-26 DIAGNOSIS — G4733 Obstructive sleep apnea (adult) (pediatric): Secondary | ICD-10-CM | POA: Diagnosis not present

## 2024-06-26 DIAGNOSIS — J4531 Mild persistent asthma with (acute) exacerbation: Secondary | ICD-10-CM

## 2024-06-26 DIAGNOSIS — J209 Acute bronchitis, unspecified: Secondary | ICD-10-CM | POA: Diagnosis not present

## 2024-06-26 DIAGNOSIS — R0602 Shortness of breath: Secondary | ICD-10-CM | POA: Diagnosis not present

## 2024-06-26 MED ORDER — ALBUTEROL SULFATE (2.5 MG/3ML) 0.083% IN NEBU: 2.5000 mg | INHALATION_SOLUTION | Freq: Four times a day (QID) | RESPIRATORY_TRACT | 12 refills | Status: AC | PRN

## 2024-06-26 NOTE — Assessment & Plan Note (Addendum)
 Getting PFT. CT scan chest reviewed by me shows findings consistent with sarcoidosis Based on the PFT we will decide to pursue PET scan for active sarcoid. Follows ophthalmology.  Will need yearly follow-up. Follows cardiology.

## 2024-06-26 NOTE — Patient Instructions (Addendum)
 Notification of test results are managed in the following manner: If there are any recommendations or changes to the plan of care discussed in office today, we will contact you and let you know what they are. If you do not hear from us , then your results are normal/expected and you can view them through your MyChart account, or a letter will be sent to you. Thank you again for trusting us  with your care [facility].  Set up w new DME company for supplies and getting CPAP monitored.  DME for nebulizer machine. Albuterol  nebs ordered.
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
## 2024-06-26 NOTE — Assessment & Plan Note (Addendum)
 Current presentation seems like acute bronchitis. Without wheezing less likely for asthma exacerbation. Finish course of steroids and azithromycin prescribed by primary care.  I will see him back in 1 month. Advair changed to Trelegy and albuterol  nebs.  Orders:   DG Chest 2 View; Future   Pulmonary Function Test; Future   albuterol  (PROVENTIL) (2.5 MG/3ML) 0.083% nebulizer solution; Take 3 mLs (2.5 mg total) by nebulization every 6 (six) hours as needed for wheezing or shortness of breath.

## 2024-06-26 NOTE — Assessment & Plan Note (Addendum)
 Per patient he is compliant. Unclear if his sleep apnea is treated on current settings. I do not have compliance data. Will set up with DME for compliance monitoring.

## 2024-06-28 DIAGNOSIS — J4531 Mild persistent asthma with (acute) exacerbation: Secondary | ICD-10-CM | POA: Diagnosis not present

## 2024-06-28 DIAGNOSIS — R053 Chronic cough: Secondary | ICD-10-CM | POA: Diagnosis not present

## 2024-06-30 ENCOUNTER — Other Ambulatory Visit: Payer: Self-pay

## 2024-06-30 ENCOUNTER — Other Ambulatory Visit: Payer: Self-pay | Admitting: Nurse Practitioner

## 2024-06-30 ENCOUNTER — Other Ambulatory Visit (HOSPITAL_COMMUNITY): Payer: Self-pay

## 2024-07-01 ENCOUNTER — Ambulatory Visit: Payer: Self-pay

## 2024-07-01 ENCOUNTER — Other Ambulatory Visit (HOSPITAL_COMMUNITY): Payer: Self-pay

## 2024-07-01 DIAGNOSIS — J4531 Mild persistent asthma with (acute) exacerbation: Secondary | ICD-10-CM

## 2024-07-02 ENCOUNTER — Other Ambulatory Visit (HOSPITAL_COMMUNITY): Payer: Self-pay

## 2024-07-02 MED ORDER — TRELEGY ELLIPTA 100-62.5-25 MCG/ACT IN AEPB
1.0000 | INHALATION_SPRAY | Freq: Every day | RESPIRATORY_TRACT | 4 refills | Status: AC
  Filled 2024-07-03: qty 60, 30d supply, fill #0
  Filled 2024-07-28 (×2): qty 60, 30d supply, fill #1
  Filled 2024-08-29: qty 60, 30d supply, fill #2
  Filled 2024-09-24 – 2024-10-08 (×3): qty 60, 30d supply, fill #3

## 2024-07-02 NOTE — Telephone Encounter (Signed)
 I see in note supposed to be on trelegy and albuterol  nebs,do not see in med list please advise

## 2024-07-02 NOTE — Addendum Note (Signed)
 Addended by: Neela Zecca D on: 07/02/2024 05:40 PM   Modules accepted: Orders

## 2024-07-03 ENCOUNTER — Telehealth: Payer: Self-pay | Admitting: Internal Medicine

## 2024-07-03 ENCOUNTER — Other Ambulatory Visit (HOSPITAL_COMMUNITY): Payer: Self-pay

## 2024-07-03 NOTE — Telephone Encounter (Signed)
 Pt has appt in [place] this am, and called at 4:54 pm yesterday.  I advsied him

## 2024-07-03 NOTE — Telephone Encounter (Signed)
 Copied from CRM [id number]. Topic: Clinical - Prescription Issue >> Jul 02, 2024  4:54 PM Rilla B wrote: Reason for CRM: Patient calling regarding new inhaler (albuterol ) that was ordered for him.  States he does not have the medication and is wondering if it can be sent to the [facility] and have it delivered like the other med that arrived today.  Please call patient to follow up @ [phone].   ----------------------------------------------------------------------- From previous Reason for Contact - Other: Reason for CRM: Patient

## 2024-07-09 DIAGNOSIS — M5416 Radiculopathy, lumbar region: Secondary | ICD-10-CM | POA: Diagnosis not present

## 2024-07-09 DIAGNOSIS — E1140 Type 2 diabetes mellitus with diabetic neuropathy, unspecified: Secondary | ICD-10-CM | POA: Diagnosis not present

## 2024-07-09 DIAGNOSIS — G8929 Other chronic pain: Secondary | ICD-10-CM | POA: Diagnosis not present

## 2024-07-16 NOTE — Progress Notes (Addendum)
 Antonio Lee.                                          MRN: [id number]   08/25/2024   The [facility] Quality Team Specialist reviewed this patient medical record for the purposes of chart review for care gap closure. The following were reviewed: chart review for care gap closure-kidney health evaluation for diabetes:uACR.    [facility] Quality Team

## 2024-07-23 ENCOUNTER — Ambulatory Visit: Admitting: Internal Medicine

## 2024-07-28 ENCOUNTER — Ambulatory Visit (INDEPENDENT_AMBULATORY_CARE_PROVIDER_SITE_OTHER)

## 2024-07-28 ENCOUNTER — Other Ambulatory Visit (HOSPITAL_COMMUNITY): Payer: Self-pay

## 2024-07-28 ENCOUNTER — Other Ambulatory Visit: Payer: Self-pay | Admitting: Nurse Practitioner

## 2024-07-28 VITALS — BP 134/82 | HR 87 | Temp 99.2°F | Ht 70.0 in | Wt 208.8 lb

## 2024-07-28 DIAGNOSIS — G4733 Obstructive sleep apnea (adult) (pediatric): Secondary | ICD-10-CM

## 2024-07-28 DIAGNOSIS — R7689 Other specified abnormal immunological findings in serum: Secondary | ICD-10-CM | POA: Diagnosis not present

## 2024-07-28 DIAGNOSIS — D869 Sarcoidosis, unspecified: Secondary | ICD-10-CM | POA: Diagnosis not present

## 2024-07-28 DIAGNOSIS — J4531 Mild persistent asthma with (acute) exacerbation: Secondary | ICD-10-CM

## 2024-07-28 DIAGNOSIS — R079 Chest pain, unspecified: Secondary | ICD-10-CM

## 2024-07-28 LAB — PULMONARY FUNCTION TEST
DL/VA % pred: 118 %
DL/VA: 4.94 ml/min/mmHg/L
DLCO unc % pred: 77 %
DLCO unc: 20.93 ml/min/mmHg
FEF 25-75 Post: 2.65 L/s
FEF 25-75 Pre: 2.9 L/s
FEF2575-%Change-Post: -8 %
FEF2575-%Pred-Post: 95 %
FEF2575-%Pred-Pre: 104 %
FEV1-%Change-Post: -2 %
FEV1-%Pred-Post: 73 %
FEV1-%Pred-Pre: 74 %
FEV1-Post: 2.54 L
FEV1-Pre: 2.6 L
FEV1FVC-%Change-Post: 3 %
FEV1FVC-%Pred-Pre: 110 %
FEV6-%Change-Post: -5 %
FEV6-%Pred-Post: 67 %
FEV6-%Pred-Pre: 71 %
FEV6-Post: 2.96 L
FEV6-Pre: 3.15 L
FEV6FVC-%Pred-Post: 105 %
FEV6FVC-%Pred-Pre: 105 %
FVC-%Change-Post: -5 %
FVC-%Pred-Post: 63 %
FVC-%Pred-Pre: 67 %
FVC-Post: 2.96 L
FVC-Pre: 3.15 L
Post FEV1/FVC ratio: 86 %
Post FEV6/FVC ratio: 100 %
Pre FEV1/FVC ratio: 83 %
Pre FEV6/FVC Ratio: 100 %
RV % pred: 95 %
RV: 2.22 L
TLC % pred: 74 %
TLC: 5.21 L

## 2024-07-28 LAB — POCT EXHALED NITRIC OXIDE: FeNO level (ppb): 5

## 2024-07-28 MED ORDER — AIRSUPRA 90-80 MCG/ACT IN AERO
1.0000 | INHALATION_SPRAY | Freq: Four times a day (QID) | RESPIRATORY_TRACT | 2 refills | Status: AC | PRN
  Filled 2024-07-28: qty 10.7, 30d supply, fill #0

## 2024-07-28 NOTE — Patient Instructions (Signed)
 Full PFT performed today.

## 2024-07-28 NOTE — Assessment & Plan Note (Addendum)
  Orders:   Nitric oxide; Future   CBC w/Diff; Future   IgE; Future   RESPIRATORY ALLERGY PANEL REGION II W/ RFLX: [place]; Future

## 2024-07-28 NOTE — Progress Notes (Signed)
 Full PFT performed today.

## 2024-07-28 NOTE — Assessment & Plan Note (Addendum)
 Orders:    CT CHEST HIGH RESOLUTION; Future

## 2024-07-28 NOTE — Assessment & Plan Note (Addendum)
 SABRA

## 2024-07-28 NOTE — Patient Instructions (Signed)
  VISIT SUMMARY: You had a follow-up appointment to evaluate your asthma and sarcoidosis. We discussed your ongoing symptoms, including your cough and shortness of breath, and reviewed the results of your pulmonary function test. We also talked about your current medications and made some adjustments to help manage your symptoms better.  YOUR PLAN: -ASTHMA WITH PERSISTENT SYMPTOMS: Asthma is a condition where your airways narrow and swell, causing breathing difficulties. You will continue using Trelegy once daily. We have changed your albuterol  inhaler to Airsupra, to be used as needed. If Valaria is too expensive, you can continue with albuterol . We also ordered blood tests to check for any allergic responses. Please schedule a follow-up appointment in three months.  -PULMONARY SARCOIDOSIS: Sarcoidosis is a condition where clusters of inflammatory cells grow in different parts of your body, including your lungs. We ordered a CT scan of your chest to evaluate your lung tissue and compare it with your 2022 scan. Depending on the CT scan results, we may consider treatment with steroids.  -OBSTRUCTIVE SLEEP APNEA: Obstructive sleep apnea is a condition where your breathing repeatedly stops and starts during sleep. You should continue using your CPAP therapy. We will coordinate with the DME company ([employer]) to ensure you get the appropriate mask for your comfort.  INSTRUCTIONS: Please schedule a follow-up appointment in three months to review your asthma and sarcoidosis management. Additionally, make sure to complete the blood tests and the CT scan of your chest as ordered. Continue using your CPAP therapy and contact [employer] if you have any issues with your mask.                      Contains text generated by Abridge.                                 Contains text generated by Abridge.
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
## 2024-07-29 ENCOUNTER — Other Ambulatory Visit (HOSPITAL_COMMUNITY): Payer: Self-pay

## 2024-07-29 ENCOUNTER — Other Ambulatory Visit: Payer: Self-pay

## 2024-07-29 LAB — RESPIRATORY ALLERGY PANEL REGION II W/ RFLX: ~~LOC~~

## 2024-07-29 LAB — INTERPRETATION:

## 2024-07-29 LAB — CBC WITH DIFFERENTIAL/PLATELET
Basophils Absolute: 0 K/uL (ref 0.0–0.1)
Basophils Relative: 0.7 % (ref 0.0–3.0)
Eosinophils Absolute: 0.1 K/uL (ref 0.0–0.7)
Eosinophils Relative: 1.1 % (ref 0.0–5.0)
HCT: 42 % (ref 39.0–52.0)
Hemoglobin: 13.2 g/dL (ref 13.0–17.0)
Lymphocytes Relative: 41.2 % (ref 12.0–46.0)
Lymphs Abs: 2.3 K/uL (ref 0.7–4.0)
MCHC: 31.5 g/dL (ref 30.0–36.0)
MCV: 75.7 fl — ABNORMAL LOW (ref 78.0–100.0)
Monocytes Absolute: 0.3 K/uL (ref 0.1–1.0)
Monocytes Relative: 5.9 % (ref 3.0–12.0)
Neutro Abs: 2.9 K/uL (ref 1.4–7.7)
Neutrophils Relative %: 51.1 % (ref 43.0–77.0)
Platelets: 195 K/uL (ref 150.0–400.0)
RBC: 5.55 Mil/uL (ref 4.22–5.81)
RDW: 16.3 % — ABNORMAL HIGH (ref 11.5–15.5)
WBC: 5.6 K/uL (ref 4.0–10.5)

## 2024-07-29 MED ORDER — ISOSORBIDE MONONITRATE ER 30 MG PO TB24
30.0000 mg | ORAL_TABLET | Freq: Every day | ORAL | 5 refills | Status: AC
  Filled 2024-07-29: qty 30, 30d supply, fill #0
  Filled 2024-08-29: qty 30, 30d supply, fill #1
  Filled 2024-09-24: qty 30, 30d supply, fill #2
  Filled 2024-09-25 – 2024-10-08 (×2): qty 30, 30d supply, fill #0

## 2024-07-30 ENCOUNTER — Other Ambulatory Visit: Payer: Self-pay

## 2024-07-31 ENCOUNTER — Encounter: Admitting: Psychology

## 2024-08-01 ENCOUNTER — Ambulatory Visit
Admission: RE | Admit: 2024-08-01 | Discharge: 2024-08-01 | Disposition: A | Discharge status: Home / Self Care | Source: Ambulatory Visit

## 2024-08-01 DIAGNOSIS — D869 Sarcoidosis, unspecified: Secondary | ICD-10-CM

## 2024-08-04 ENCOUNTER — Ambulatory Visit: Payer: Self-pay

## 2024-08-05 ENCOUNTER — Other Ambulatory Visit (HOSPITAL_COMMUNITY): Payer: Self-pay

## 2024-08-05 MED ORDER — HYDROCORTISONE ACETATE 25 MG RE SUPP
25.0000 mg | Freq: Every day | RECTAL | 1 refills | Status: AC
  Filled 2024-08-05 – 2024-09-26 (×5): qty 14, 14d supply, fill #0

## 2024-08-06 ENCOUNTER — Other Ambulatory Visit: Payer: Self-pay

## 2024-08-06 DIAGNOSIS — K760 Fatty (change of) liver, not elsewhere classified: Secondary | ICD-10-CM

## 2024-08-08 ENCOUNTER — Encounter: Payer: Self-pay | Admitting: Pharmacist

## 2024-08-08 ENCOUNTER — Other Ambulatory Visit: Payer: Self-pay

## 2024-08-08 ENCOUNTER — Other Ambulatory Visit (HOSPITAL_COMMUNITY): Payer: Self-pay

## 2024-08-13 ENCOUNTER — Other Ambulatory Visit: Payer: Self-pay

## 2024-08-14 NOTE — Progress Notes (Signed)
 Physical Therapy Missed Visit Note  Payor: [employer] MEDICARE ADV / Plan: [employer]HC MA DUAL COMPLETE RPPO-SNP / Product Type: Medicare Advantage /     The patient did not attend therapy appointment.  Reason:  No Show  Total Missed Visits:    Patient Contact:  No Contact with patient  Future Appointments:  Patient has additional appointments.  Action:  No action required.

## 2024-08-15 ENCOUNTER — Ambulatory Visit: Admitting: Neurology

## 2024-08-20 NOTE — Progress Notes (Signed)
 Cardiology Office Note:   Date:  08/20/2024  ID:  Antonio Pouch Sr., DOB [birth year], MRN [id number] PCP: Cleotilde, [place]  E, PA  [facility] Providers Cardiologist:  Georganna Archer, MD { Chief Complaint:  Chief Complaint  Patient presents with   Shortness of Breath      History of Present Illness:   Antonio Mcfetridge Sr. is a 63 y.o. male with a PMH of CVA (2019), HTN, HLD, DM2, pulmonary sarcoidosis, hepatitis C, RA, asthma, OSA, schizophrenia who presents for follow up.  Patient last seen by Jackee Alberts in September for general follow-up.  Patient reported exertional chest pain at that time and underwent coronary CTA which showed nonobstructive CAD and a calcium  score 1.  However, CT imaging did demonstrate calcified left hilar and subcarinal lymph nodes consistent with prior granulomatous disease.  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  Today the patient reports that he is no longer having exertional chest pain, but mostly exertional dyspnea.  He says that he is been feeling short of breath for several months with exertion and some days feels like he cannot do anything physically.  He is taking his inhalers for his lung disease, but despite adherence he has ongoing DOE.  He denies chest pain, presyncope, syncope, palpitations, swelling.  He states always medications as prescribed without side effect.  No other complaints.   Past Medical History:  Diagnosis Date   Asthma    Bipolar disorder (HCC)    COVID-19    CVA (cerebral vascular accident) (HCC)    Depression    Diabetes mellitus, type II (HCC)    Fatty liver    GERD (gastroesophageal reflux disease)    Grade I diastolic dysfunction    History of kidney stones    Hypertension    MI (myocardial infarction) (HCC)    Osteoarthritis    Sleep apnea    wears CPAP     Studies  Reviewed:    EKG: No new ECG       Cardiac Studies & Procedures   ______________________________________________________________________________________________   STRESS TESTS  MYOCARDIAL PERFUSION IMAGING 12/05/2023  Interpretation Summary   The study is normal. The study is low risk.   No ST deviation was noted.   LV perfusion is normal.   Left ventricular function is normal. Nuclear stress EF: 59%. The left ventricular ejection fraction is normal (55-65%). End diastolic cavity size is normal. End systolic cavity size is normal.   Prior study not available for comparison.  Normal perfusion. LVEF 59% with normal wall motion. This is a low risk study. No prior for comparison.   ECHOCARDIOGRAM  ECHOCARDIOGRAM COMPLETE 12/05/2023  Narrative ECHOCARDIOGRAM REPORT    Patient Name:   Antonio Lee Date of Exam: 12/05/2023 Medical Rec #:  [id number]       Height:       70.0 in Accession #:    [id number]      Weight:       202.0 lb Date of Birth:  02-22-1961       BSA:          2.096 m Patient Age:    63 years        BP:           158/98 mmHg Patient Gender: M               HR:  69 bpm. Exam Location:  [address]  Procedure: 2D Echo, 3D Echo, Cardiac Doppler, Color Doppler and Strain Analysis (Both Spectral and Color Flow Doppler were utilized during procedure).  Indications:    786.05 SOB  History:        Patient has prior history of Echocardiogram examinations, most recent 05/27/2021. Previous Myocardial Infarction, Stroke; Risk Factors:Diabetes and Sleep Apnea.  Sonographer:    Waldo Guadalajara RCS Referring Phys: [phone] JACKEE DEL, JR DICK  IMPRESSIONS   1. Left ventricular ejection fraction, by estimation, is 50 to 55%. Left ventricular ejection fraction by 3D volume is 52 %. The left ventricle has low normal function. The left ventricle has no regional wall motion abnormalities. There is mild concentric left ventricular hypertrophy. Left ventricular  diastolic parameters are indeterminate. The average left ventricular global longitudinal strain is -19.1 %. The global longitudinal strain is normal. 2. Right ventricular systolic function is normal. The right ventricular size is normal. 3. The mitral valve is degenerative. Trivial mitral valve regurgitation. No evidence of mitral stenosis. 4. The aortic valve is normal in structure. Aortic valve regurgitation is not visualized. No aortic stenosis is present. 5. There is mild dilatation of the ascending aorta, measuring 38 mm. 6. The inferior vena cava is normal in size with greater than 50% respiratory variability, suggesting right atrial pressure of 3 mmHg.  FINDINGS Left Ventricle: Left ventricular ejection fraction, by estimation, is 50 to 55%. Left ventricular ejection fraction by 3D volume is 52 %. The left ventricle has low normal function. The left ventricle has no regional wall motion abnormalities. The average left ventricular global longitudinal strain is -19.1 %. Strain was performed and the global longitudinal strain is normal. The left ventricular internal cavity size was normal in size. There is mild concentric left ventricular hypertrophy. Left ventricular diastolic parameters are indeterminate. Normal left ventricular filling pressure.  Right Ventricle: The right ventricular size is normal. No increase in right ventricular wall thickness. Right ventricular systolic function is normal.  Left Atrium: Left atrial size was normal in size.  Right Atrium: Right atrial size was normal in size.  Pericardium: There is no evidence of pericardial effusion.  Mitral Valve: The mitral valve is degenerative in appearance. There is mild calcification of the mitral valve leaflet(s). Trivial mitral valve regurgitation. No evidence of mitral valve stenosis.  Tricuspid Valve: The tricuspid valve is normal in structure. Tricuspid valve regurgitation is not demonstrated. No evidence of tricuspid  stenosis.  Aortic Valve: The aortic valve is normal in structure. Aortic valve regurgitation is not visualized. No aortic stenosis is present.  Pulmonic Valve: The pulmonic valve was normal in structure. Pulmonic valve regurgitation is trivial. No evidence of pulmonic stenosis.  Aorta: The aortic root is normal in size and structure. There is mild dilatation of the ascending aorta, measuring 38 mm.  Venous: The inferior vena cava is normal in size with greater than 50% respiratory variability, suggesting right atrial pressure of 3 mmHg.  IAS/Shunts: No atrial level shunt detected by color flow Doppler.  Additional Comments: 3D was performed not requiring image post processing on an independent workstation and was normal.   LEFT VENTRICLE PLAX 2D LVIDd:         4.30 cm         Diastology LVIDs:         3.20 cm         LV e' medial:    5.22 cm/s LV PW:  1.20 cm         LV E/e' medial:  15.1 LV IVS:        1.50 cm         LV e' lateral:   6.52 cm/s LVOT diam:     2.00 cm         LV E/e' lateral: 12.0 LV SV:         69 LV SV Index:   33              2D Longitudinal LVOT Area:     3.14 cm        Strain 2D Strain GLS   -17.6 % (A4C): 2D Strain GLS   -19.3 % (A3C): 2D Strain GLS   -20.4 % (A2C): 2D Strain GLS   -19.1 % Avg:  3D Volume EF LV 3D EF:    Left ventricul ar ejection fraction by 3D volume is 52 %.  3D Volume EF: 3D EF:        52 % LV EDV:       137 ml LV ESV:       66 ml LV SV:        71 ml  RIGHT VENTRICLE RV Basal diam:  3.00 cm RV S prime:     12.70 cm/s TAPSE (M-mode): 1.7 cm  LEFT ATRIUM             Index        RIGHT ATRIUM           Index LA diam:        3.00 cm 1.43 cm/m   RA Area:     11.90 cm LA Vol (A2C):   52.9 ml 25.24 ml/m  RA Volume:   26.80 ml  12.79 ml/m LA Vol (A4C):   42.3 ml 20.18 ml/m LA Biplane Vol: 50.9 ml 24.28 ml/m AORTIC VALVE LVOT Vmax:   100.00 cm/s LVOT Vmean:  65.500 cm/s LVOT VTI:    0.220 m  AORTA Ao  Root diam: 3.30 cm Ao Asc diam:  3.80 cm  MITRAL VALVE MV Area (PHT):             SHUNTS MV Decel Time:             Systemic VTI:  0.22 m MV E velocity: 78.60 cm/s  Systemic Diam: 2.00 cm MV A velocity: 89.10 cm/s MV E/A ratio:  0.88  Wilbert Bihari MD Electronically signed by Wilbert Bihari MD Signature Date/Time: 12/05/2023/11:18:07 AM    Final      CT SCANS  CT CORONARY MORPH W/CTA COR W/SCORE 06/20/2024  Addendum 07/12/2024  1:14 PM ADDENDUM REPORT: 07/12/2024 13:12  ADDENDUM: EXAM: OVER-READ INTERPRETATION CT CHEST  TECHNIQUE: The following report is an over-read performed by radiologist Dr. Toribio Faes of [facility]. This over-read does not include interpretation of cardiac or coronary anatomy or pathology; that interpretation by cardiologist is attached. Without and with IV contrast. 100 mL (iohexol  (OMNIPAQUE ) 350 MG/ML injection 100 mL IOHEXOL  350 MG/ML SOLN).  COMPARISON: CT 06/14/2021  CLINICAL HISTORY: Nonspecific chest pain. Cardiology to read, GR to over-read.  FINDINGS:  No pleural or pericardial effusion.  Calcified left hilar and subcarinal lymph nodes consistent with old granulomatous disease.  Visualized upper abdomen unremarkable.  No pneumothorax. Dependent atelectasis or scarring in both lower lobes. Pleural-based scarring in the inferior right middle lobe, stable.  Regional bones unremarkable.  IMPRESSION: 1. Calcified left hilar and subcarinal lymph nodes consistent with  prior granulomatous disease. 2. Dependent atelectasis or scarring in both lower lobes. 3. Pleural-based scarring in the inferior right middle lobe, stable.   Electronically Signed By: JONETTA Faes M.D. On: 07/12/2024 13:12  Narrative CLINICAL DATA:  EXAM: OVER-READ INTERPRETATION CT CHEST  TECHNIQUE: The following report is an over-read performed by radiologist Dr. Toribio Faes of [facility]. This over-read does not  include interpretation of cardiac or coronary anatomy or pathology; that interpretation by cardiologist is attached. Without and with IV contrast. 100 mL (iohexol  (OMNIPAQUE ) 350 MG/ML injection 100 mL IOHEXOL  350 MG/ML SOLN).  COMPARISON: CT 06/14/2021  CLINICAL HISTORY: Nonspecific chest pain. Cardiology to read, GR to over-read.  FINDINGS:  No pleural or pericardial effusion.  Calcified left hilar and subcarinal lymph nodes consistent with old granulomatous disease.  Visualized upper abdomen unremarkable.  No pneumothorax. Dependent atelectasis or scarring in both lower lobes. Pleural-based scarring in the inferior right middle lobe, stable.  Regional bones unremarkable.  IMPRESSION: 1. Calcified left hilar and subcarinal lymph nodes consistent with prior granulomatous disease. 2. Dependent atelectasis or scarring in both lower lobes. 3. Pleural-based scarring in the inferior right middle lobe, stable.  Electronically signed by: Dayne Hassell MD 06/25/2024 10:09 AM EDT RP Workstation: [id number]   Electronically Signed By: JONETTA Faes M.D. On: 07/12/2024 13:12     ______________________________________________________________________________________________      Risk Assessment/Calculations:          Physical Exam:     VS:  BP (!) 142/86   Pulse 91   Ht 5' 10 (1.778 m)   Wt 206 lb (93.4 kg)   SpO2 97%   BMI 29.56 kg/m      Wt Readings from Last 3 Encounters:  07/28/24 208 lb 12.8 oz (94.7 kg)  06/26/24 205 lb (93 kg)  06/05/24 204 lb (92.5 kg)     GEN: Well nourished, well developed, in no acute distress NECK: No JVD; No carotid bruits CARDIAC: RRR, no murmurs, rubs, gallops RESPIRATORY:  Clear to auscultation without rales, wheezing or rhonchi  ABDOMEN: Soft, non-tender, non-distended, normal bowel sounds EXTREMITIES:  Warm and well perfused, no edema; No deformity, 2+ radial pulses PSYCH: Normal mood and affect   Assessment &  Plan Dyspnea on exertion - Patient reporting ongoing dyspnea on exertion for several months.  CCTA was negative for obstructive coronary disease. -He underwent a complete echocardiogram earlier this year that showed low normal EF without any significant valvular disease. -His physical exam is also unrevealing. -At this point is unclear to me whether or not this is in any way shape or form a cardiovascular issue or primary pulmonary issue or something else.  He does have sarcoidosis which makes me think about the possibility of cardiac sarcoid; however, he has no conduction disease and when I have independently reviewed his echocardiogram his EF was closer to 55% and 50%. -To hopefully help better understand what is driving his DOE, I Antonio pursue a CPX for further evaluation. -I Antonio also consider a cardiac MRI down the line if clinically appropriate, but we Antonio table that for now. CPX Follow-up in 3 months Mixed hyperlipidemia - Has nonobstructive CAD with a calcium  score 1. - Antonio check lipid panel Lipid panel Continue atorvastatin  40 mg daily Primary hypertension - BP is elevated. Continue amlodipine  10 mg daily Continue Imdur  30 mg daily Start losartan 25 mg daily 2-week blood pressure log          This note was written with the assistance  of a dictation microphone or AI dictation software. Please excuse any typos or grammatical errors.   Signed, Georganna Archer, MD 08/20/2024 7:33 PM    [facility]

## 2024-08-21 ENCOUNTER — Telehealth: Payer: Self-pay | Admitting: Student in an Organized Health Care Education/Training Program

## 2024-08-21 ENCOUNTER — Ambulatory Visit: Attending: Internal Medicine | Admitting: Student in an Organized Health Care Education/Training Program

## 2024-08-21 ENCOUNTER — Other Ambulatory Visit (HOSPITAL_COMMUNITY): Payer: Self-pay

## 2024-08-21 ENCOUNTER — Encounter: Payer: Self-pay | Admitting: Student in an Organized Health Care Education/Training Program

## 2024-08-21 VITALS — BP 142/86 | HR 91 | Ht 70.0 in | Wt 206.0 lb

## 2024-08-21 DIAGNOSIS — E782 Mixed hyperlipidemia: Secondary | ICD-10-CM | POA: Insufficient documentation

## 2024-08-21 DIAGNOSIS — I10 Essential (primary) hypertension: Secondary | ICD-10-CM | POA: Insufficient documentation

## 2024-08-21 DIAGNOSIS — R0609 Other forms of dyspnea: Secondary | ICD-10-CM | POA: Diagnosis not present

## 2024-08-21 LAB — LIPID PANEL
Chol/HDL Ratio: 6.6 ratio — ABNORMAL HIGH (ref 0.0–5.0)
Cholesterol, Total: 236 mg/dL — ABNORMAL HIGH (ref 100–199)
HDL: 36 mg/dL — ABNORMAL LOW (ref >39)
LDL Chol Calc (NIH): 111 mg/dL — ABNORMAL HIGH (ref 0–99)
Triglycerides: 511 mg/dL — ABNORMAL HIGH (ref 0–149)
VLDL Cholesterol Cal: 89 mg/dL — ABNORMAL HIGH (ref 5–40)

## 2024-08-21 MED ORDER — OMRON 3 SERIES BP MONITOR DEVI
0 refills | Status: AC
  Filled 2024-08-21 – 2024-09-18 (×3): qty 1, 30d supply, fill #0

## 2024-08-21 MED ORDER — LOSARTAN POTASSIUM 25 MG PO TABS
25.0000 mg | ORAL_TABLET | Freq: Every day | ORAL | 3 refills | Status: AC
  Filled 2024-08-21: qty 90, 90d supply, fill #0

## 2024-08-21 NOTE — Assessment & Plan Note (Signed)
-   Has nonobstructive CAD with a calcium  score 1. - Will check lipid panel Lipid panel Continue atorvastatin  40 mg daily

## 2024-08-21 NOTE — Telephone Encounter (Signed)
 Pt called stating he could not get the Bp meter due to cost and wanted to know if there are any other options. Please advise

## 2024-08-21 NOTE — Patient Instructions (Addendum)
 Medication Instructions:  START Losartan 25 mg daily   *If you need a refill on your cardiac medications before your next appointment, please call your pharmacy*  Lab Work: LIPID PANEL   If you have labs (blood work) drawn today and your tests are completely normal, you will receive your results only by: MyChart Message (if you have MyChart) OR A paper copy in the mail If you have any lab test that is abnormal or we need to change your treatment, we will call you to review the results.  Testing/Procedures: CPX  Your physician has recommended that you have a cardiopulmonary stress test (CPX). CPX testing is a non-invasive measurement of heart and lung function. It replaces a traditional treadmill stress test. This type of test provides a tremendous amount of information that relates not only to your present condition but also for future outcomes. This test combines measurements of you ventilation, respiratory gas exchange in the lungs, electrocardiogram (EKG), blood pressure and physical response before, during, and following an exercise protocol.  CHECK BLOOD PRESSURE TWICE DAILY FOR 2 WEEKS AND ADVISE OFFICE OF RESULTS.   Follow-Up: At [facility], you and your health needs are our priority.  As part of our continuing mission to provide you with exceptional heart care, our providers are all part of one team.  This team includes your primary Cardiologist (physician) and Advanced Practice Providers or APPs (Physician Assistants and Nurse Practitioners) who all work together to provide you with the care you need, when you need it.  Your next appointment:   3 month(s)  Provider:   Georganna Archer, MD     Other Instructions     CPX TEST PATIENT INSTRUCTIONS   You are scheduled for a Cardiopulmonary Exercise (CPX) Test at [facility] on:  ---------------------------   Expect to be in the lab for 2 hours.  Please arrive to through the Main Entrance A (Free valet  parking is available) and proceed to admitting 30 minutes prior to your scheduled appointment. You may be asked to reschedule your test if you arrive 20 minutes or more after your scheduled appointment time.  After checking in through admitting, proceed to [facility] waiting room.  General instructions for the day of the test (Please follow all instructions from your physician): Refrain from ingesting a heavy meal, alcohol, or caffeine or using tobacco products within 2 hours of the test (please DO NOT FAST for more than 8 hours). You may have all other non-alcoholic, non-caffeinated beverages a light snack (crackers, a piece of fruit, carrot sticks, toast, bagel, etc.) up to your appointment time. Avoid significant exertion or exercise within 24 hours of your test. Be prepared to exercise and sweat. Your clothing should permit freedom of movement and include walking or running shoes. Women bring loose-fitting, short-sleeved blouse. This evaluation may be fatiguing, and you may wish to have someone accompany you to the assessment to drive you home afterward. Bring a list of your medications with you, including dosage and frequency you take the medication (i.e., once per day, twice per day, etc). Take all medications as prescribed, unless noted below or instructed to do so by your physician.  Please do not take the following medications prior to your CPX Hold insulin  or oral hypoglycemics that are required to be taken with food ONLY if the patient is NPO. If they have eaten, diabetic medications do not need to be held:  Brief description of the test: A brief lung  function test will be performed. This will involve you taking deep breaths and blowing out hard and fast through your mouth. During these, a nose clip will be on your nose and you will be breathing through a breathing device.  For the exercise portion of the test you will be walking on a motorized treadmill, or riding a  stationary bike, to your maximal effort or until symptoms such as chest pain, shortness of breath, leg pain or dizziness limit your exercise. Like the lung function test, you will be breathing in and out of a breathing device through your mouth (a nose clip will be on your nose again). Your heart rate, ECG, blood pressure, oxygen saturation, breathing rate and depth, amount of oxygen you consume and amount of carbon dioxide you produce will be measured and monitored throughout the exercise test.  If you need to cancel or reschedule your appointment please call: [phone] If you have any further questions please call your physician or Josette Pesa, MS, ACSM-RCEP at [phone].  Date created: August 24, 2004  Date last updated: March 23, 2022

## 2024-08-21 NOTE — Assessment & Plan Note (Signed)
-   BP is elevated. Continue amlodipine  10 mg daily Continue Imdur  30 mg daily Start losartan 25 mg daily 2-week blood pressure log

## 2024-08-21 NOTE — Telephone Encounter (Signed)
 Routed to Marit HUGHS and primary nurse

## 2024-08-22 ENCOUNTER — Inpatient Hospital Stay: Admission: RE | Admit: 2024-08-22 | Discharge: 2024-08-22

## 2024-08-22 DIAGNOSIS — K760 Fatty (change of) liver, not elsewhere classified: Secondary | ICD-10-CM

## 2024-08-23 ENCOUNTER — Ambulatory Visit: Payer: Self-pay | Admitting: Student in an Organized Health Care Education/Training Program

## 2024-08-23 DIAGNOSIS — E782 Mixed hyperlipidemia: Secondary | ICD-10-CM

## 2024-08-25 ENCOUNTER — Telehealth: Payer: Self-pay | Admitting: Licensed Clinical Social Worker

## 2024-08-25 ENCOUNTER — Other Ambulatory Visit: Payer: Self-pay | Admitting: Internal Medicine

## 2024-08-25 ENCOUNTER — Other Ambulatory Visit (HOSPITAL_COMMUNITY): Payer: Self-pay

## 2024-08-25 DIAGNOSIS — R209 Unspecified disturbances of skin sensation: Secondary | ICD-10-CM

## 2024-08-25 DIAGNOSIS — E1165 Type 2 diabetes mellitus with hyperglycemia: Secondary | ICD-10-CM

## 2024-08-25 MED ORDER — TRULICITY 0.75 MG/0.5ML ~~LOC~~ SOAJ
0.7500 mg | SUBCUTANEOUS | 3 refills | Status: AC
  Filled 2024-08-25 – 2024-09-16 (×3): qty 2, 28d supply, fill #0
  Filled 2024-09-16: qty 2, 28d supply, fill #1
  Filled 2024-09-18 (×2): qty 2, 28d supply, fill #0
  Filled 2024-10-14: qty 2, 28d supply, fill #1

## 2024-08-25 NOTE — Telephone Encounter (Signed)
 Pt calling to f/u on the matter and requesting c/b. Made pt aware Marit already plans to reach out to him today. Please advise.

## 2024-08-25 NOTE — Telephone Encounter (Signed)
 H&V Care Navigation CSW Progress Note  Clinical Social Worker contacted patient by phone to f/u on multiple calls for assistance with BP cuff. Note pt has Dual complete Medicare and Medicaid but that unfortunately does not cover BP cuffs. LCSW able to reach pt today at [phone] and he shares that he went to pharmacy to pick it up and it was $30 which is too much for him to afford currently. LCSW inquired if pt had access to transportation to get to [facility]. He confirms he has a ride bc he is currently at his PCP appt next door and can use his Medicaid ride to get to the pharmacy.   Pt provided with Omron cuff and batteries and this clinical research associate completed Medicaid transportation form.  Patient is participating in a Managed Medicaid Plan:  [facility] Medicare and Medicaid  SDOH Screenings   Tobacco Use: Medium Risk (08/21/2024)    Marit Lark, MSW, LCSW Clinical Social Worker II [facility] Heart/Vascular Care Navigation  [phone]- work cell phone (preferred)

## 2024-08-26 ENCOUNTER — Other Ambulatory Visit (HOSPITAL_COMMUNITY): Payer: Self-pay

## 2024-08-26 ENCOUNTER — Other Ambulatory Visit: Payer: Self-pay

## 2024-08-26 MED ORDER — ATORVASTATIN CALCIUM 80 MG PO TABS: 80.0000 mg | ORAL_TABLET | Freq: Every day | ORAL | 3 refills | Status: AC

## 2024-08-26 MED ORDER — EZETIMIBE 10 MG PO TABS: 10.0000 mg | ORAL_TABLET | Freq: Every day | ORAL | 3 refills | Status: AC

## 2024-08-27 ENCOUNTER — Other Ambulatory Visit (HOSPITAL_COMMUNITY): Payer: Self-pay

## 2024-08-27 MED ORDER — HYDROCORTISONE ACETATE 25 MG RE SUPP
25.0000 mg | Freq: Every day | RECTAL | 1 refills | Status: AC
  Filled 2024-08-27 – 2024-09-25 (×4): qty 14, 14d supply, fill #0

## 2024-08-29 ENCOUNTER — Encounter: Payer: Self-pay | Admitting: Pharmacist

## 2024-08-29 ENCOUNTER — Other Ambulatory Visit (HOSPITAL_COMMUNITY): Payer: Self-pay

## 2024-09-01 ENCOUNTER — Other Ambulatory Visit: Payer: Self-pay

## 2024-09-01 ENCOUNTER — Telehealth: Payer: Self-pay | Admitting: Student in an Organized Health Care Education/Training Program

## 2024-09-01 NOTE — Telephone Encounter (Signed)
 Pt calling to give BP results.  12/22 - 180/100                147/88

## 2024-09-01 NOTE — Telephone Encounter (Signed)
 Spoke with pt who was advised to keep a 2 week BP log and call back with results.  Pt was not able to obtain a monitor until 08/25/24.  Today is the first time he has used it.  Per pt's report - 1st reading of 180/100 was this morning before his medication and in his right arm.  2nd reading of 147/88 was a few hours after his medication in in his left arm. Advised pt to check his at least 1 hour after taking his medication in the morning and after sitting down calmly for at least 5 minutes with both feet on the floor and arm resting at the level of his heart.  He will also start keeping up with his heart rates and call back with results in 2 weeks.

## 2024-09-02 ENCOUNTER — Other Ambulatory Visit: Payer: Self-pay

## 2024-09-02 ENCOUNTER — Other Ambulatory Visit (HOSPITAL_COMMUNITY): Payer: Self-pay

## 2024-09-02 NOTE — Telephone Encounter (Signed)
 PT calling back with BP Readings:  12/23 : BP: 129/88 Pulse: 116

## 2024-09-02 NOTE — Telephone Encounter (Signed)
 12/23 : BP: 129/88 Pulse: 116   Pt called back with above BP readings from today. Pt stated he took this 2 hours after BP medicine. Pt also stated he feels better, slight headache. Advised patient to keep a log of his BP readings for 2 weeks and his HR and get back to us . Encouraged to stay hydrated. Pt also advised if BP returns to be elevated or stays elevated to reach out. Also advised of ED precautions if any blurred vision, dizziness, headache with elevated BP. Will forward to Dr. Floretta for update. Pt verbalizes understanding of plan.

## 2024-09-08 ENCOUNTER — Other Ambulatory Visit: Payer: Self-pay

## 2024-09-08 ENCOUNTER — Other Ambulatory Visit

## 2024-09-08 ENCOUNTER — Ambulatory Visit
Admission: RE | Admit: 2024-09-08 | Discharge: 2024-09-08 | Disposition: A | Source: Ambulatory Visit | Attending: Internal Medicine | Admitting: Internal Medicine

## 2024-09-08 DIAGNOSIS — E1165 Type 2 diabetes mellitus with hyperglycemia: Secondary | ICD-10-CM

## 2024-09-08 DIAGNOSIS — R209 Unspecified disturbances of skin sensation: Secondary | ICD-10-CM

## 2024-09-16 ENCOUNTER — Other Ambulatory Visit (HOSPITAL_COMMUNITY): Payer: Self-pay

## 2024-09-17 ENCOUNTER — Other Ambulatory Visit (HOSPITAL_COMMUNITY): Payer: Self-pay

## 2024-09-17 ENCOUNTER — Other Ambulatory Visit: Payer: Self-pay

## 2024-09-18 ENCOUNTER — Other Ambulatory Visit (HOSPITAL_COMMUNITY): Payer: Self-pay

## 2024-09-18 ENCOUNTER — Other Ambulatory Visit: Payer: Self-pay

## 2024-09-19 ENCOUNTER — Other Ambulatory Visit: Payer: Self-pay

## 2024-09-24 ENCOUNTER — Other Ambulatory Visit: Payer: Self-pay

## 2024-09-24 ENCOUNTER — Encounter: Admitting: Psychology

## 2024-09-24 ENCOUNTER — Other Ambulatory Visit (HOSPITAL_COMMUNITY): Payer: Self-pay

## 2024-09-25 ENCOUNTER — Other Ambulatory Visit (HOSPITAL_COMMUNITY): Payer: Self-pay

## 2024-09-25 ENCOUNTER — Other Ambulatory Visit: Payer: Self-pay

## 2024-09-25 ENCOUNTER — Encounter: Payer: Self-pay | Admitting: Pharmacist

## 2024-09-26 ENCOUNTER — Other Ambulatory Visit: Payer: Self-pay

## 2024-09-26 ENCOUNTER — Other Ambulatory Visit (HOSPITAL_COMMUNITY): Payer: Self-pay

## 2024-09-29 ENCOUNTER — Other Ambulatory Visit (HOSPITAL_COMMUNITY): Payer: Self-pay

## 2024-09-29 ENCOUNTER — Encounter (HOSPITAL_COMMUNITY): Payer: Self-pay | Admitting: Emergency Medicine

## 2024-09-29 ENCOUNTER — Ambulatory Visit (HOSPITAL_COMMUNITY)
Admission: EM | Admit: 2024-09-29 | Discharge: 2024-09-29 | Disposition: A | Discharge status: Home / Self Care | Attending: Family Medicine | Admitting: Family Medicine

## 2024-09-29 ENCOUNTER — Other Ambulatory Visit: Payer: Self-pay

## 2024-09-29 DIAGNOSIS — L03317 Cellulitis of buttock: Secondary | ICD-10-CM | POA: Diagnosis not present

## 2024-09-29 MED ORDER — CLINDAMYCIN HCL 300 MG PO CAPS
300.0000 mg | ORAL_CAPSULE | Freq: Three times a day (TID) | ORAL | 0 refills | Status: AC
  Filled 2024-09-29: qty 21, 7d supply, fill #0

## 2024-09-29 MED ORDER — MUPIROCIN 2 % EX OINT
1.0000 | TOPICAL_OINTMENT | Freq: Two times a day (BID) | CUTANEOUS | 0 refills | Status: AC
  Filled 2024-09-29: qty 22, 30d supply, fill #0

## 2024-09-29 MED ORDER — HYDROCODONE-ACETAMINOPHEN 5-325 MG PO TABS
1.0000 | ORAL_TABLET | Freq: Four times a day (QID) | ORAL | 0 refills | Status: AC | PRN
  Filled 2024-09-29: qty 12, 3d supply, fill #0

## 2024-09-29 NOTE — Discharge Instructions (Signed)
--  Take clindamycin  300 mg-- 1 capsule 3 times daily for 7 days; this is the oral antibiotic for infection  Put mupirocin  ointment on the sore areas twice daily until improved; at least put these on the areas of broken skin.  Hydrocodone  5 mg--1 tablet every 6 hours as needed for pain.  This is best taken with food.  It can cause sleepiness or dizziness  Warm sitz bath or warm compresses or warm shower for 10 minutes or so 2 or 3 times a day can help with circulation in this area and help healing and help with antibiotic effectiveness.  If the area in question is getting larger instead of improving, please return  If you begin feeling more ill, as an high fever, vomiting, or weakness, please consider going to emergency room for further evaluation

## 2024-09-29 NOTE — ED Notes (Signed)
 At bedside for provider examination of painful area of body

## 2024-09-29 NOTE — ED Triage Notes (Signed)
 Abscess on left buttocks.  Reports having smaller abscess in this area about a week ago.  Used hot showers and baths and they opened and drained on their own.  However, there is one that is getting larger.  Patient reports he has not had abscess in the past.
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
## 2024-10-03 ENCOUNTER — Emergency Department (HOSPITAL_COMMUNITY)

## 2024-10-03 ENCOUNTER — Other Ambulatory Visit: Payer: Self-pay

## 2024-10-03 ENCOUNTER — Emergency Department (HOSPITAL_COMMUNITY)
Admission: EM | Admit: 2024-10-03 | Discharge: 2024-10-03 | Disposition: A | Discharge status: Home / Self Care | Attending: Emergency Medicine | Admitting: Emergency Medicine

## 2024-10-03 ENCOUNTER — Encounter (HOSPITAL_COMMUNITY): Payer: Self-pay

## 2024-10-03 DIAGNOSIS — F10129 Alcohol abuse with intoxication, unspecified: Secondary | ICD-10-CM | POA: Diagnosis not present

## 2024-10-03 DIAGNOSIS — S52222A Displaced transverse fracture of shaft of left ulna, initial encounter for closed fracture: Secondary | ICD-10-CM | POA: Diagnosis not present

## 2024-10-03 DIAGNOSIS — K760 Fatty (change of) liver, not elsewhere classified: Secondary | ICD-10-CM | POA: Insufficient documentation

## 2024-10-03 DIAGNOSIS — N400 Enlarged prostate without lower urinary tract symptoms: Secondary | ICD-10-CM | POA: Diagnosis not present

## 2024-10-03 DIAGNOSIS — Y92410 Unspecified street and highway as the place of occurrence of the external cause: Secondary | ICD-10-CM | POA: Diagnosis not present

## 2024-10-03 DIAGNOSIS — Y907 Blood alcohol level of 200-239 mg/100 ml: Secondary | ICD-10-CM | POA: Insufficient documentation

## 2024-10-03 DIAGNOSIS — S0990XA Unspecified injury of head, initial encounter: Secondary | ICD-10-CM | POA: Diagnosis not present

## 2024-10-03 DIAGNOSIS — S01511A Laceration without foreign body of lip, initial encounter: Secondary | ICD-10-CM | POA: Diagnosis not present

## 2024-10-03 DIAGNOSIS — M79602 Pain in left arm: Secondary | ICD-10-CM | POA: Diagnosis present

## 2024-10-03 LAB — URINALYSIS, ROUTINE W REFLEX MICROSCOPIC
Bilirubin Urine: NEGATIVE
Glucose, UA: NEGATIVE mg/dL
Hgb urine dipstick: NEGATIVE
Ketones, ur: NEGATIVE mg/dL
Leukocytes,Ua: NEGATIVE
Nitrite: NEGATIVE
Protein, ur: NEGATIVE mg/dL
Specific Gravity, Urine: 1.026 (ref 1.005–1.030)
pH: 6 (ref 5.0–8.0)

## 2024-10-03 LAB — COMPREHENSIVE METABOLIC PANEL WITH GFR
ALT: 66 U/L — ABNORMAL HIGH (ref 0–44)
AST: 139 U/L — ABNORMAL HIGH (ref 15–41)
Albumin: 4.1 g/dL (ref 3.5–5.0)
Alkaline Phosphatase: 114 U/L (ref 38–126)
Anion gap: 15 (ref 5–15)
BUN: 5 mg/dL — ABNORMAL LOW (ref 8–23)
CO2: 20 mmol/L — ABNORMAL LOW (ref 22–32)
Calcium: 8.8 mg/dL — ABNORMAL LOW (ref 8.9–10.3)
Chloride: 104 mmol/L (ref 98–111)
Creatinine, Ser: 0.78 mg/dL (ref 0.61–1.24)
GFR, Estimated: >60 mL/min
Glucose, Bld: 133 mg/dL — ABNORMAL HIGH (ref 70–99)
Potassium: 4.2 mmol/L (ref 3.5–5.1)
Sodium: 139 mmol/L (ref 135–145)
Total Bilirubin: 0.4 mg/dL (ref 0.0–1.2)
Total Protein: 7.5 g/dL (ref 6.5–8.1)

## 2024-10-03 LAB — CBC
HCT: 39.6 % (ref 39.0–52.0)
Hemoglobin: 12.6 g/dL — ABNORMAL LOW (ref 13.0–17.0)
MCH: 23.7 pg — ABNORMAL LOW (ref 26.0–34.0)
MCHC: 31.8 g/dL (ref 30.0–36.0)
MCV: 74.6 fL — ABNORMAL LOW (ref 80.0–100.0)
Platelets: 276 K/uL (ref 150–400)
RBC: 5.31 MIL/uL (ref 4.22–5.81)
RDW: 15.1 % (ref 11.5–15.5)
WBC: 6.2 K/uL (ref 4.0–10.5)
nRBC: 0 % (ref 0.0–0.2)

## 2024-10-03 LAB — I-STAT CG4 LACTIC ACID, ED: Lactic Acid, Venous: 3.1 mmol/L (ref 0.5–1.9)

## 2024-10-03 LAB — I-STAT CHEM 8, ED
BUN: 4 mg/dL — ABNORMAL LOW (ref 8–23)
Calcium, Ion: 1.07 mmol/L — ABNORMAL LOW (ref 1.15–1.40)
Chloride: 105 mmol/L (ref 98–111)
Creatinine, Ser: 1.1 mg/dL (ref 0.61–1.24)
Glucose, Bld: 133 mg/dL — ABNORMAL HIGH (ref 70–99)
HCT: 42 % (ref 39.0–52.0)
Hemoglobin: 14.3 g/dL (ref 13.0–17.0)
Potassium: 3.6 mmol/L (ref 3.5–5.1)
Sodium: 143 mmol/L (ref 135–145)
TCO2: 22 mmol/L (ref 22–32)

## 2024-10-03 LAB — ETHANOL: Alcohol, Ethyl (B): 233 mg/dL — ABNORMAL HIGH

## 2024-10-03 LAB — SAMPLE TO BLOOD BANK

## 2024-10-03 LAB — PROTIME-INR
INR: 1.1 (ref 0.8–1.2)
Prothrombin Time: 14.7 s (ref 11.4–15.2)

## 2024-10-03 MED ORDER — OXYCODONE HCL 5 MG PO TABS
5.0000 mg | ORAL_TABLET | Freq: Once | ORAL | Status: AC
  Administered 2024-10-03: 5 mg via ORAL
  Filled 2024-10-03: qty 1

## 2024-10-03 MED ORDER — ACETAMINOPHEN 500 MG PO TABS
1000.0000 mg | ORAL_TABLET | Freq: Once | ORAL | Status: AC
  Administered 2024-10-03: 1000 mg via ORAL
  Filled 2024-10-03: qty 2

## 2024-10-03 MED ORDER — SODIUM CHLORIDE 0.9 % IV BOLUS
1000.0000 mL | Freq: Once | INTRAVENOUS | Status: AC
  Administered 2024-10-03: 1000 mL via INTRAVENOUS

## 2024-10-03 MED ORDER — IOHEXOL 350 MG/ML SOLN
75.0000 mL | Freq: Once | INTRAVENOUS | Status: AC | PRN
  Administered 2024-10-03: 75 mL via INTRAVENOUS

## 2024-10-03 MED ORDER — KETOROLAC TROMETHAMINE 15 MG/ML IJ SOLN
15.0000 mg | Freq: Once | INTRAMUSCULAR | Status: AC
  Administered 2024-10-03: 15 mg via INTRAVENOUS
  Filled 2024-10-03: qty 1

## 2024-10-03 NOTE — Progress Notes (Signed)
 Orthopedic Tech Progress Note Patient Details:  Antonio Anastasi Sr. 1961/07/17 [id number]  Ortho Devices Type of Ortho Device: Sugartong splint, Arm sling Ortho Device/Splint Location: LUE Ortho Device/Splint Interventions: Ordered, Application   Post Interventions Patient Tolerated: Well Instructions Provided: Care of device, Poper ambulation with device  Malynda Smolinski A Arabela Basaldua 10/03/2024, 2:46 PM

## 2024-10-03 NOTE — Discharge Instructions (Addendum)
 Follow up with the hand surgery office in the clinic. Call them today to set up an appointment to be seen.

## 2024-10-03 NOTE — ED Notes (Signed)
 Got manual blood pressure got patient on the monitor patient is resting with nurse at bedside
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
## 2024-10-03 NOTE — ED Notes (Signed)
 Patient transported to CT

## 2024-10-03 NOTE — ED Triage Notes (Incomplete)
 Antonio Lee

## 2024-10-03 NOTE — ED Notes (Signed)
 Ortho tech at bedside
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
## 2024-10-07 ENCOUNTER — Other Ambulatory Visit (HOSPITAL_COMMUNITY): Payer: Self-pay

## 2024-10-10 ENCOUNTER — Telehealth (HOSPITAL_BASED_OUTPATIENT_CLINIC_OR_DEPARTMENT_OTHER): Payer: Self-pay | Admitting: *Deleted

## 2024-10-10 NOTE — Telephone Encounter (Signed)
"  ° °  Pre-operative Risk Assessment    Patient Name: Antonio Rehman Sr.  DOB: [birth year] MRN: [id number]   Date of last office visit: 08/21/24 DR. SULLIVAN Date of next office visit: 11/17/24 DR. SULLIVAN -3 MONTH F/U   Request for Surgical Clearance    Procedure:  LEFT ULNAR SHAFT OPEN REDUCTION INTERNAL FIXATION  Date of Surgery:  Clearance 10/17/24                                Surgeon:  DR. JINNY ROBYNN RIAS Surgeon's Group or Practice Name:  [facility] Phone number:  [phone] MEGAN DAVIS Fax number:  [phone]   Type of Clearance Requested:   - Medical   Type of Anesthesia:  MAC WITH BLOCK   Additional requests/questions:    Bonney Niels Jest   10/10/2024, 11:18 AM   "

## 2024-10-10 NOTE — Telephone Encounter (Signed)
 Dr. Floretta,  Mr. Kelsay is requesting left ulnar ORIF.  Surgery is scheduled for 10/17/2024.  He was recently seen by you in clinic on 08/21/2024.  During that time he was seen for shortness of breath.  His prior coronary CTA was negative for obstructive disease.  His prior echocardiogram showed an EF of around 50-55%.  You ordered cardiopulmonary stress testing.  Would you be able to comment on cardiac risk for upcoming surgery?  Thank you for your help.  Please direct your response to CV DIV preop pool.  Josefa HERO. Jozelyn Kuwahara NP-C     10/10/2024, 11:47 AM [facility] [address] [place], [place] [address] Office [phone]

## 2024-10-13 NOTE — Telephone Encounter (Signed)
" °  ° °  Primary Cardiologist: Georganna Archer, MD  Chart reviewed as part of pre-operative protocol coverage. Given past medical history and time since last visit, based on ACC/AHA guidelines, Antonio Kollman Sr. would be at acceptable risk for the planned procedure without further cardiovascular testing.   His RCRI is moderate risk, 6.6% risk of major cardiac event.  I will route this recommendation to the requesting party via Epic fax function and remove from pre-op pool.  Please call with questions.  [name] NP-C     10/13/2024, 7:28 AM [facility] [address] [place], [place] [address] Office [phone]      "

## 2024-10-14 ENCOUNTER — Other Ambulatory Visit (HOSPITAL_COMMUNITY): Payer: Self-pay

## 2024-11-05 ENCOUNTER — Ambulatory Visit

## 2024-11-17 ENCOUNTER — Encounter: Admitting: Psychology

## 2024-11-17 ENCOUNTER — Ambulatory Visit: Admitting: Student in an Organized Health Care Education/Training Program

## 2024-12-22 ENCOUNTER — Ambulatory Visit: Admitting: Neurology
